# Patient Record
Sex: Female | Born: 1960 | Hispanic: No | Marital: Married | Smoking: Never smoker
Health system: Southern US, Community
[De-identification: ages and names within clinical notes are randomized; demographics above are authoritative.]

## PROBLEM LIST (undated history)

## (undated) DIAGNOSIS — E039 Hypothyroidism, unspecified: Secondary | ICD-10-CM

## (undated) DIAGNOSIS — IMO0002 Reserved for concepts with insufficient information to code with codable children: Secondary | ICD-10-CM

## (undated) DIAGNOSIS — M51369 Other intervertebral disc degeneration, lumbar region without mention of lumbar back pain or lower extremity pain: Secondary | ICD-10-CM

## (undated) DIAGNOSIS — D219 Benign neoplasm of connective and other soft tissue, unspecified: Secondary | ICD-10-CM

## (undated) DIAGNOSIS — J45909 Unspecified asthma, uncomplicated: Secondary | ICD-10-CM

## (undated) DIAGNOSIS — D1801 Hemangioma of skin and subcutaneous tissue: Secondary | ICD-10-CM

## (undated) DIAGNOSIS — H1851 Endothelial corneal dystrophy: Secondary | ICD-10-CM

## (undated) DIAGNOSIS — M5126 Other intervertebral disc displacement, lumbar region: Secondary | ICD-10-CM

## (undated) DIAGNOSIS — L309 Dermatitis, unspecified: Secondary | ICD-10-CM

## (undated) DIAGNOSIS — H02889 Meibomian gland dysfunction of unspecified eye, unspecified eyelid: Secondary | ICD-10-CM

## (undated) DIAGNOSIS — M5136 Other intervertebral disc degeneration, lumbar region: Secondary | ICD-10-CM

## (undated) DIAGNOSIS — L719 Rosacea, unspecified: Secondary | ICD-10-CM

## (undated) DIAGNOSIS — H01009 Unspecified blepharitis unspecified eye, unspecified eyelid: Secondary | ICD-10-CM

## (undated) DIAGNOSIS — D649 Anemia, unspecified: Secondary | ICD-10-CM

## (undated) DIAGNOSIS — B070 Plantar wart: Secondary | ICD-10-CM

## (undated) DIAGNOSIS — F411 Generalized anxiety disorder: Secondary | ICD-10-CM

## (undated) DIAGNOSIS — B029 Zoster without complications: Secondary | ICD-10-CM

## (undated) DIAGNOSIS — I781 Nevus, non-neoplastic: Secondary | ICD-10-CM

## (undated) DIAGNOSIS — N390 Urinary tract infection, site not specified: Secondary | ICD-10-CM

## (undated) DIAGNOSIS — M1990 Unspecified osteoarthritis, unspecified site: Secondary | ICD-10-CM

## (undated) DIAGNOSIS — H269 Unspecified cataract: Secondary | ICD-10-CM

## (undated) DIAGNOSIS — D179 Benign lipomatous neoplasm, unspecified: Secondary | ICD-10-CM

## (undated) HISTORY — DX: Hypothyroidism, unspecified: E03.9

## (undated) HISTORY — DX: Urinary tract infection, site not specified: N39.0

## (undated) HISTORY — PX: URETHRAL STRICTURE DILATATION: SHX477

## (undated) HISTORY — PX: SHOULDER SURGERY: SHX246

## (undated) HISTORY — DX: Unspecified asthma, uncomplicated: J45.909

## (undated) HISTORY — DX: Endothelial corneal dystrophy: H18.51

## (undated) HISTORY — DX: Other intervertebral disc displacement, lumbar region: M51.26

## (undated) HISTORY — PX: WISDOM TOOTH EXTRACTION: SHX21

## (undated) HISTORY — DX: Zoster without complications: B02.9

## (undated) HISTORY — PX: OTHER SURGICAL HISTORY: SHX169

## (undated) HISTORY — PX: CATARACT EXTRACTION, BILATERAL: SHX1313

## (undated) HISTORY — DX: Reserved for concepts with insufficient information to code with codable children: IMO0002

## (undated) HISTORY — PX: CAPSULOTOMY: SHX379

---

## 2007-06-03 ENCOUNTER — Other Ambulatory Visit: Admission: RE | Admit: 2007-06-03 | Discharge: 2007-06-03 | Payer: Self-pay | Admitting: Obstetrics & Gynecology

## 2008-06-15 ENCOUNTER — Other Ambulatory Visit: Admission: RE | Admit: 2008-06-15 | Discharge: 2008-06-15 | Payer: Self-pay | Admitting: Obstetrics & Gynecology

## 2008-09-18 DIAGNOSIS — J309 Allergic rhinitis, unspecified: Secondary | ICD-10-CM | POA: Insufficient documentation

## 2012-04-10 DIAGNOSIS — K439 Ventral hernia without obstruction or gangrene: Secondary | ICD-10-CM | POA: Insufficient documentation

## 2012-04-30 DIAGNOSIS — D179 Benign lipomatous neoplasm, unspecified: Secondary | ICD-10-CM

## 2012-04-30 HISTORY — DX: Benign lipomatous neoplasm, unspecified: D17.9

## 2012-09-16 DIAGNOSIS — H18519 Endothelial corneal dystrophy, unspecified eye: Secondary | ICD-10-CM

## 2012-09-16 HISTORY — DX: Endothelial corneal dystrophy, unspecified eye: H18.519

## 2013-02-24 ENCOUNTER — Encounter: Payer: Self-pay | Admitting: Obstetrics & Gynecology

## 2013-03-07 DIAGNOSIS — L719 Rosacea, unspecified: Secondary | ICD-10-CM | POA: Insufficient documentation

## 2013-03-23 DIAGNOSIS — Z87898 Personal history of other specified conditions: Secondary | ICD-10-CM | POA: Insufficient documentation

## 2013-06-29 DIAGNOSIS — H18519 Endothelial corneal dystrophy, unspecified eye: Secondary | ICD-10-CM | POA: Insufficient documentation

## 2013-06-29 DIAGNOSIS — E349 Endocrine disorder, unspecified: Secondary | ICD-10-CM | POA: Insufficient documentation

## 2013-06-29 DIAGNOSIS — H02889 Meibomian gland dysfunction of unspecified eye, unspecified eyelid: Secondary | ICD-10-CM | POA: Insufficient documentation

## 2013-06-29 DIAGNOSIS — H18839 Recurrent erosion of cornea, unspecified eye: Secondary | ICD-10-CM | POA: Insufficient documentation

## 2013-06-29 DIAGNOSIS — H01009 Unspecified blepharitis unspecified eye, unspecified eyelid: Secondary | ICD-10-CM | POA: Insufficient documentation

## 2013-06-29 DIAGNOSIS — H16223 Keratoconjunctivitis sicca, not specified as Sjogren's, bilateral: Secondary | ICD-10-CM | POA: Insufficient documentation

## 2013-07-16 ENCOUNTER — Telehealth: Payer: Self-pay | Admitting: Certified Nurse Midwife

## 2013-07-16 MED ORDER — NITROFURANTOIN MONOHYD MACRO 100 MG PO CAPS: 100.0000 mg | ORAL_CAPSULE | ORAL | Status: DC | PRN

## 2013-07-16 NOTE — Telephone Encounter (Signed)
Patient wants a refill of Nitrofur-nacro 100 mg  Ph[facility]acy [facility] [place] phone [phone]

## 2013-07-16 NOTE — Telephone Encounter (Signed)
Okay with Dr. Hyacinth Meeker to place refill. Order placed, [facility] with [facility] and patient notified.

## 2013-07-16 NOTE — Telephone Encounter (Signed)
Pt states she has an on going agreement with DL to call in an antibiotic for infection.

## 2013-08-11 ENCOUNTER — Telehealth: Payer: Self-pay | Admitting: Certified Nurse Midwife

## 2013-08-11 MED ORDER — NORETHIN ACE-ETH ESTRAD-FE 1-20 MG-MCG PO TABS: 1.0000 | ORAL_TABLET | Freq: Every day | ORAL | Status: DC

## 2013-08-11 NOTE — Telephone Encounter (Signed)
Patient need refill of Junel fe will run out 5 days before appt. Pharmacy [facility] [place] [phone]

## 2013-08-11 NOTE — Telephone Encounter (Signed)
AEX scheduled for 08/20/13 #1 pack with no refills sent to last patient until AEX. Patient notified

## 2013-08-19 ENCOUNTER — Encounter: Payer: Self-pay | Admitting: Certified Nurse Midwife

## 2013-08-20 ENCOUNTER — Ambulatory Visit (INDEPENDENT_AMBULATORY_CARE_PROVIDER_SITE_OTHER): Payer: BC Managed Care – PPO | Admitting: Certified Nurse Midwife

## 2013-08-20 ENCOUNTER — Encounter: Payer: Self-pay | Admitting: Certified Nurse Midwife

## 2013-08-20 VITALS — BP 100/60 | HR 78 | Resp 18 | Ht 66.0 in | Wt 151.0 lb

## 2013-08-20 DIAGNOSIS — Z0000 Encounter for general adult medical examination without abnormal findings: Secondary | ICD-10-CM

## 2013-08-20 DIAGNOSIS — Z01419 Encounter for gynecological examination (general) (routine) without abnormal findings: Secondary | ICD-10-CM

## 2013-08-20 DIAGNOSIS — Z309 Encounter for contraceptive management, unspecified: Secondary | ICD-10-CM

## 2013-08-20 DIAGNOSIS — Z8041 Family history of malignant neoplasm of ovary: Secondary | ICD-10-CM

## 2013-08-20 LAB — POCT URINALYSIS DIPSTICK
Protein, UA: NEGATIVE
Urobilinogen, UA: NEGATIVE
pH, UA: 6

## 2013-08-20 LAB — HEMOGLOBIN, FINGERSTICK: Hemoglobin, fingerstick: 14.1 g/dL (ref 12.0–16.0)

## 2013-08-20 MED ORDER — NORETHIN ACE-ETH ESTRAD-FE 1-20 MG-MCG PO TABS: 1.0000 | ORAL_TABLET | Freq: Every day | ORAL | Status: DC

## 2013-08-20 NOTE — Progress Notes (Signed)
52 y.o. G2P2002Married Caucasian Fe here for annual exam. Periods normal and no changes. No health issues today. Saw PCP this year with labs done, normal per patient. Busy with work, spouse and exercise!   Patient's last menstrual period was 08/04/2013.          Sexually active: yes  The current method of family planning is OCP (estrogen/progesterone).    Exercising: yes  Walking, lifting weights, cardio, running 5-7 days a week Smoker:  no  Health Maintenance: Pap: 08/12/13 neg MMG: 11/14 normal Colonoscopy:  2013/2014 normal, repeat in 10 years BMD:   never TDaP:  Less than 10 years Labs: Hgb 14.1    U/A neg   reports that she has never smoked. She has never used smokeless tobacco. She reports that she drinks about 0.5 ounces of alcohol per week. She reports that she does not use illicit drugs.  Past Medical History  Diagnosis Date  . Asthma   . Frequent UTI     post coital  . Positional vertigo   . 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 Urethral stricture dilatation      Current Outpatient Prescriptions  Medication Sig Dispense Refill  . aspirin-acetaminophen-caffeine (EXCEDRIN MIGRAINE) 250-250-65 MG per tablet Take by mouth as needed for headache.      . Cholecalciferol (VITAMIN D PO) Take 1,000 Int'l Units by mouth daily.      . Montelukast Sodium (SINGULAIR PO) Take by mouth as needed.      . norethindrone-ethinyl estradiol (JUNEL FE,GILDESS FE,LOESTRIN FE) 1-20 MG-MCG tablet Take 1 tablet by mouth daily.  1 Package  0  . OVER THE COUNTER MEDICATION 2 (two) times daily. Combination of flax seed oil and fish oil daily for dry eyes       No current facility-administered medications for this visit.    Family History  Problem Relation Age of Onset  . Cancer Mother     ovarian  . Hypertension Mother   . Hyperlipidemia Mother   . Breast cancer Maternal Grandmother   . Hyperlipidemia Father   . ALS Father      ROS:  Pertinent items are noted in HPI.  Otherwise, a comprehensive ROS was negative.  Exam:   BP 100/60  Pulse 78  Resp 18  Ht 5\' 6"  (1.676 m)  Wt 151 lb (68.493 kg)  BMI 24.38 kg/m2  LMP 08/04/2013 Height: 5\' 6"  (167.6 cm)  Ht Readings from Last 3 Encounters:  08/20/13 5\' 6"  (1.676 m)    General appearance: alert, cooperative and appears stated age Head: Normocephalic, without obvious abnormality, atraumatic Neck: no adenopathy, supple, symmetrical, trachea midline and thyroid normal to inspection and palpation and non-palpable Lungs: clear to auscultation bilaterally Breasts: normal appearance, no masses or tenderness, No nipple retraction or dimpling, No nipple discharge or bleeding, No axillary or supraclavicular adenopathy Heart: regular rate and rhythm Abdomen: soft, non-tender; no masses,  no organomegaly Extremities: extremities normal, atraumatic, no cyanosis or edema Skin: Skin color, texture, turgor normal. No rashes or lesions Lymph nodes: Cervical, supraclavicular, and axillary nodes normal. No abnormal inguinal nodes palpated Neurologic: Grossly normal   Pelvic: External genitalia:  no lesions              Urethra:  normal appearing urethra with no masses, tenderness or lesions              Bartholin's and Skene's: normal  Vagina: normal appearing vagina with normal color and discharge, no lesions              Cervix: normal, nontender              Pap taken: no Bimanual Exam:  Uterus:  normal size, contour, position, consistency, mobility, non-tender and anteflexed              Adnexa: normal adnexa and no mass, fullness, tenderness               Rectovaginal: Confirms               Anus:  normal sphincter tone, no lesions  A:  Well Woman with normal exam  Contraception OCP desired  Family history of breast and ovarian cancer with negative genetic screen  P:     Reviewed health and wellness pertinent to exam  Rx Loestrin1/20 see order,  discussed decreasing dose next year,and discussed menopausal expectations regarding cycles. Handout given  Patient desires CA 125 and PUS again this year for ovarian screen. Will schedule with Dr Hyacinth Meeker per request  Lab CA 125  Pap smear as per guidelines   Mammogram yearly pap smear not taken today  counseled on breast self exam, mammography screening, menopause, adequate intake of calcium and vitamin D, diet and exercise, Kegel's exercises  return annually or prn  An After Visit Summary was printed and given to the patient.

## 2013-08-20 NOTE — Patient Instructions (Signed)

## 2013-08-21 LAB — CA 125: CA 125: 14.6 U/mL (ref 0.0–30.2)

## 2013-08-23 NOTE — Progress Notes (Signed)
Reviewed personally.  M. Suzanne [place] Dohmen, MD.  

## 2013-08-25 ENCOUNTER — Telehealth: Payer: Self-pay | Admitting: Emergency Medicine

## 2013-08-25 DIAGNOSIS — Z1273 Encounter for screening for malignant neoplasm of ovary: Secondary | ICD-10-CM

## 2013-08-25 NOTE — Telephone Encounter (Signed)
Notes Recorded by Verner Chol, CNM on 08/23/2013 at 8:44 AM Notify CA125 within normal range and has decreased since last year, today 14.6, 2013- 16.0 PUS order placed she will be contacted for scheduling.   Message left to return call to [place] at [phone]. Message left on home number as requested in medical record release form dated 08/20/13.

## 2013-08-25 NOTE — Telephone Encounter (Signed)
Message copied by Joeseph Amor on Wed Aug 25, 2013  8:41 AM ------      Message from: Eliezer Bottom      Created: Mon Aug 23, 2013  2:35 PM       Please notify ------

## 2013-08-26 NOTE — Telephone Encounter (Signed)
Message copied by Joeseph Amor on Thu Aug 26, 2013  9:26 AM ------      Message from: Eliezer Bottom      Created: Mon Aug 23, 2013  2:35 PM       Please notify ------

## 2013-08-26 NOTE — Telephone Encounter (Signed)
Patient called and was informed of PR quote

## 2013-08-26 NOTE — Telephone Encounter (Signed)
Message from Verner Chol CNM given. Patient verbalized understanding. PUS scheduled for patient request 10/07/13. carolynn can you precert.

## 2013-08-26 NOTE — Telephone Encounter (Signed)
LMTCB to discuss insurance benefits with patient.

## 2013-10-07 ENCOUNTER — Ambulatory Visit (INDEPENDENT_AMBULATORY_CARE_PROVIDER_SITE_OTHER): Payer: BC Managed Care – PPO | Admitting: Obstetrics & Gynecology

## 2013-10-07 ENCOUNTER — Other Ambulatory Visit: Payer: Self-pay | Admitting: Obstetrics & Gynecology

## 2013-10-07 ENCOUNTER — Ambulatory Visit (INDEPENDENT_AMBULATORY_CARE_PROVIDER_SITE_OTHER): Payer: BC Managed Care – PPO

## 2013-10-07 VITALS — BP 102/68 | Ht 66.0 in | Wt 155.0 lb

## 2013-10-07 DIAGNOSIS — N83299 Other ovarian cyst, unspecified side: Secondary | ICD-10-CM

## 2013-10-07 DIAGNOSIS — N83209 Unspecified ovarian cyst, unspecified side: Secondary | ICD-10-CM

## 2013-10-07 DIAGNOSIS — Z1273 Encounter for screening for malignant neoplasm of ovary: Secondary | ICD-10-CM

## 2013-10-07 DIAGNOSIS — D259 Leiomyoma of uterus, unspecified: Secondary | ICD-10-CM

## 2013-10-07 DIAGNOSIS — D219 Benign neoplasm of connective and other soft tissue, unspecified: Secondary | ICD-10-CM

## 2013-10-07 DIAGNOSIS — Z803 Family history of malignant neoplasm of breast: Secondary | ICD-10-CM

## 2013-10-07 DIAGNOSIS — Z8041 Family history of malignant neoplasm of ovary: Secondary | ICD-10-CM

## 2013-10-07 NOTE — Progress Notes (Signed)
53 y.o.  G2P2 MWF here for a pelvic ultrasound due to strong family hx of breast and ovarian cancer.  Pt's mother died in her 40s of breast cancer and there are three toher woman on her mother's side with breast cancer.  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  Initially it was unsure whether this was important.  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  Pt underwent testing and was negative but still has concerns.  Has considered prophylactic surgery.  Currently, on OCPs for risk reduction and BC.  Sexually active:  Yes  Contraception: oral contraceptives (estrogen/progesterone)  FINDINGS: UTERUS: 8.4 x 5.9 x 5.8cm with 2.1cm, 1.2cm and 1.9cm fibroids (last u/s 2 years ago showed only 2 fibroids 23mm and 36mm each EMS: 8.74mm ADNEXA:   Left ovary 2.6 x 1.3 x 1.3cm with 61mm collapsed corpus luteal cyst     Right ovary 4.2 x 3.4 x 2.9cm with 1.7 and 2.7cm cysts, smooth walled and avascular. Prior u/s 11/13 showed no cysts on right and 3.4cm cyst on left CUL DE SAC: no free fluid  Recent Ca-125 was 16.  In EPIC.  Reviewed images with patient as well as her history.  She desires yearly PUS but didn't do last year when I was out on maternity leave.  Encouraged her to do this yearly, even if I am not here for some reason.  She states that she really just wants to have these conversations with me so she cannot guarantee she will continue ultrasound/ca-125 with anyone else.  However, she is glad I am back and comfortable with today's findings.    I reviewed hx with her and updated family hx in [place].  Maternal great aunt, maternal second cousin (mother's first cousin), and Maternal great grand mother with breast cancer.  Also, will scan genetic testing into EPIC so will be part of electronic chart.  Pt in agreement with this as well.  Assessment:  Uterine fibroids, ovarian cysts that appear functional, Strong family hx of breast and  ovarian cancer  Plan: AEX 1 year.  Repeat PUS and Ca-125 then.  ~25 minutes spent with patient >50% of time was in face to face discussion of above.

## 2013-10-08 ENCOUNTER — Encounter: Payer: Self-pay | Admitting: Obstetrics & Gynecology

## 2013-10-08 DIAGNOSIS — N83299 Other ovarian cyst, unspecified side: Secondary | ICD-10-CM | POA: Insufficient documentation

## 2013-10-08 DIAGNOSIS — D259 Leiomyoma of uterus, unspecified: Secondary | ICD-10-CM | POA: Insufficient documentation

## 2013-10-08 DIAGNOSIS — Z8041 Family history of malignant neoplasm of ovary: Secondary | ICD-10-CM | POA: Insufficient documentation

## 2013-10-08 DIAGNOSIS — Z803 Family history of malignant neoplasm of breast: Secondary | ICD-10-CM | POA: Insufficient documentation

## 2013-10-08 NOTE — Patient Instructions (Signed)
Please call with any new problems or concerns.

## 2014-06-17 ENCOUNTER — Telehealth: Payer: Self-pay | Admitting: Certified Nurse Midwife

## 2014-06-17 NOTE — Telephone Encounter (Signed)
Not needed

## 2014-06-20 ENCOUNTER — Other Ambulatory Visit: Payer: Self-pay | Admitting: Certified Nurse Midwife

## 2014-06-20 ENCOUNTER — Telehealth: Payer: Self-pay | Admitting: Certified Nurse Midwife

## 2014-06-20 DIAGNOSIS — N951 Menopausal and female climacteric states: Secondary | ICD-10-CM

## 2014-06-20 NOTE — Telephone Encounter (Signed)
Patient calling requesting to speak with nurse about coordinating her lab work she needs with her menstrual cycle.

## 2014-06-20 NOTE — Telephone Encounter (Signed)
Orders in encounter closed

## 2014-06-20 NOTE — Telephone Encounter (Signed)
Spoke with patient. Patient states that she is currently on OCP and was told by Regina Eck CNM that she needed to come in when she turned 41 for blood work. Was advised needs to be on last day of placebo pills in OCP pack so that levels can be checked and she can stop OCP or get placed on a lower dose. Patient would like to come in on last day of OCP before aex. Appointment scheduled for Nov 30th at 8:30am as she states this will fall perfect with her pack.  Regina Eck CNM please place orders.

## 2014-07-18 ENCOUNTER — Encounter: Payer: Self-pay | Admitting: Obstetrics & Gynecology

## 2014-07-19 ENCOUNTER — Telehealth: Payer: Self-pay | Admitting: Certified Nurse Midwife

## 2014-07-19 ENCOUNTER — Ambulatory Visit (INDEPENDENT_AMBULATORY_CARE_PROVIDER_SITE_OTHER): Payer: BC Managed Care – PPO | Admitting: Nurse Practitioner

## 2014-07-19 ENCOUNTER — Encounter: Payer: Self-pay | Admitting: Nurse Practitioner

## 2014-07-19 VITALS — BP 100/60 | HR 88 | Temp 98.1°F | Ht 66.0 in | Wt 159.0 lb

## 2014-07-19 DIAGNOSIS — R300 Dysuria: Secondary | ICD-10-CM

## 2014-07-19 DIAGNOSIS — R350 Frequency of micturition: Secondary | ICD-10-CM

## 2014-07-19 MED ORDER — CIPROFLOXACIN HCL 500 MG PO TABS: 500.0000 mg | ORAL_TABLET | Freq: Two times a day (BID) | ORAL | Status: DC

## 2014-07-19 NOTE — Telephone Encounter (Signed)
Dr. Sabra Heck, please disregard prior message to you.  Spoke with patient.  She has used Macrobid 100 mg bid x 7 days, starting last Saturday 07/09/14 and completing 07/16/14. Feels that all symptoms returned last night, dysuria, increased frequency. No fevers or back pain.  Patient requesting another rx for treatment. She has restarted Macrobid last night and taken two doses with some relief.  Advised needs office visit if requests additional treatment. Patient scheduled for office visit with Milford Cage, [place] today at 1530, patient choice of time.  Routing to [employer], FNP

## 2014-07-19 NOTE — Telephone Encounter (Signed)
Patient calling requesting to speak with the nurse about prescribing another antibiotic to treat her urinary tract infection. She declined an appointment at this time.  [id number] [place], [place] - [address]

## 2014-07-19 NOTE — Progress Notes (Signed)
S:  53 y.o.MW Fe  presents with complaint of UTI. Symptoms that began on October 24- 31 treated with Macrobid. With symptoms of dysuria, urinary frequency, urinary urgency. Pertinent negatives: patient is having no constitutional symptoms, denying fever, chills, anorexia, or weight loss.. Sexually active yes  Symptoms related to post coital: Yes.  Current method of birth control is OCP.  Same partner without change. Last UTI documented  2 weeks ago. Chronic history of UTI and several urology evaluations.  History of Pyelonephritis 07/16/13.    ROS:  fatigued and chills at times this am  O alert, oriented to person, place, and time, normal mood, behavior, speech, dress, motor activity, and thought processes   healthy,  alert and  not in acute distress  mild  No CVA tenderness  deferred   Diagnostic Test:    Urinalysis:  Chemstrip not easily seen secondary to AZO - but nitrites did turn + home urine chemstrip also + for nitrites and leuk's   urine culture  Assessment: R/O UTI   Plan:  Maintain adequate hydration. Follow up if symptoms not improving, and as needed.  Medication Therapy: Cipro 500 mg BID for a week  Discussed a change of preventative antibiotics from Macrobid to Septra - but need to wait on culture.   Lab:TOC if Urine Culture is positive        RV

## 2014-07-19 NOTE — Telephone Encounter (Signed)
Patient with hx of post coital UTI's. Has prior Rx for Macrobid 100 mg PO from Dr. Sabra Heck from 07/16/13. Next annual scheduled with Regina Eck CNM 08/30/14.  Okay to place refill?   Message left to return call to [place] at [phone].

## 2014-07-19 NOTE — Telephone Encounter (Signed)
Pt calling Olivia Mackie Fast back. Pt states she will be in her office for the next hour and a half, so called there if possible [phone]

## 2014-07-19 NOTE — Patient Instructions (Signed)

## 2014-07-20 LAB — URINE CULTURE
COLONY COUNT: NO GROWTH
Organism ID, Bacteria: NO GROWTH

## 2014-07-20 LAB — URINALYSIS, MICROSCOPIC ONLY
CASTS: NONE SEEN
Crystals: NONE SEEN

## 2014-07-20 NOTE — Progress Notes (Signed)
Encounter reviewed by Dr. Brook Silva.  

## 2014-07-27 ENCOUNTER — Telehealth: Payer: Self-pay | Admitting: Nurse Practitioner

## 2014-07-27 NOTE — Telephone Encounter (Signed)
Pt calling for results of urine culture

## 2014-07-27 NOTE — Telephone Encounter (Signed)
Spoke with patient. Advised of results as seen below from Milford Cage, [place]. States that she has completed full dosage of Cipro one day ago and thinks she may be having symptoms again. "I am concerned because I was already taking an antibiotic when I did the culture which could cause it to show negative. [place] different [place] now and can't get to you guys. I am going to monitor it and if it gets worse or doesn't go away I will be seen some where here for them to recheck my urine." Advised patient if symptoms increase or are not resolving we would be happy to get patient in to see Milford Cage, FNP for another culture and treatment. If she is out of the area will need to be seen at urgent care so that she has proper treatment. Patient is agreeable.  Notes Recorded by Milford Cage, FNP on 07/24/2014 at 7:22 PM Let patient know that urine culture was negative. She has enough med's now that if this was just urethritis her symptoms should be better so she can stop med's..  Routing to provider for final review. Patient agreeable to disposition. Will close encounter

## 2014-08-15 ENCOUNTER — Other Ambulatory Visit (INDEPENDENT_AMBULATORY_CARE_PROVIDER_SITE_OTHER): Payer: BC Managed Care – PPO

## 2014-08-15 DIAGNOSIS — N951 Menopausal and female climacteric states: Secondary | ICD-10-CM

## 2014-08-16 LAB — FOLLICLE STIMULATING HORMONE: FSH: 8.8 m[IU]/mL

## 2014-08-18 LAB — ANTI MULLERIAN HORMONE: AMH AssessR: <0.03 ng/mL

## 2014-08-19 ENCOUNTER — Telehealth: Payer: Self-pay

## 2014-08-19 NOTE — Telephone Encounter (Signed)
Spoke with patient. Patient would like to know if results have returned. Advised of results as seen below. "She was checking these levels to see if I needed to start on a lower dose of birth control or could stop my birth control all together." Advised patient would send a message to Regina Eck CNM and return call with further recommendations and instructions. Patient is agreeable   Notes Recorded by Regina Eck, CNM on 08/19/2014 at 12:57 PM Notify patient that [facility] is not showing menopause as of yet, but [facility] shows no fertility.   Routing to provider for final review. Patient agreeable to disposition. Will close encounter

## 2014-08-23 ENCOUNTER — Ambulatory Visit: Payer: BC Managed Care – PPO | Admitting: Certified Nurse Midwife

## 2014-08-23 NOTE — Telephone Encounter (Signed)
She can stop OCP if desired.

## 2014-08-23 NOTE — Telephone Encounter (Signed)
Spoke with patient. Advised patient of message as seen below. Patient is agreeable and verbalizes understanding. Will stop OCP.  Routing to provider for final review. Patient agreeable to disposition. Will close encounter

## 2014-08-30 ENCOUNTER — Ambulatory Visit (INDEPENDENT_AMBULATORY_CARE_PROVIDER_SITE_OTHER): Payer: BC Managed Care – PPO | Admitting: Certified Nurse Midwife

## 2014-08-30 ENCOUNTER — Encounter: Payer: Self-pay | Admitting: Certified Nurse Midwife

## 2014-08-30 VITALS — BP 100/68 | HR 100 | Resp 16 | Ht 65.5 in | Wt 159.0 lb

## 2014-08-30 DIAGNOSIS — Z8041 Family history of malignant neoplasm of ovary: Secondary | ICD-10-CM

## 2014-08-30 DIAGNOSIS — Z124 Encounter for screening for malignant neoplasm of cervix: Secondary | ICD-10-CM

## 2014-08-30 DIAGNOSIS — Z0000 Encounter for general adult medical examination without abnormal findings: Secondary | ICD-10-CM

## 2014-08-30 DIAGNOSIS — N390 Urinary tract infection, site not specified: Secondary | ICD-10-CM

## 2014-08-30 DIAGNOSIS — Z01419 Encounter for gynecological examination (general) (routine) without abnormal findings: Secondary | ICD-10-CM

## 2014-08-30 LAB — POCT URINALYSIS DIPSTICK
BILIRUBIN UA: NEGATIVE
GLUCOSE UA: NEGATIVE
KETONES UA: NEGATIVE
Nitrite, UA: NEGATIVE
Protein, UA: NEGATIVE
Urobilinogen, UA: NEGATIVE
pH, UA: 5

## 2014-08-30 LAB — HEMOGLOBIN, FINGERSTICK: Hemoglobin, fingerstick: 14.6 g/dL (ref 12.0–16.0)

## 2014-08-30 NOTE — Patient Instructions (Signed)

## 2014-08-30 NOTE — Progress Notes (Signed)
53 y.o. G85P2002 Married Caucasian Fe here for annual exam. Periods normal, no issues. Plans to stop OCP with this cycle. She has had labs to indicate no fertility. [place]ley did not indicate menopause range and patient is aware she will continue to have menses. Busy with work! Worried about Earlie Raveling who saw a Ship broker at [employer] commit suicide. Has her in counseling,which is helping.. Patient sees PCP for aex/labs, no issues. Having asthma flare, but using meds. Denies any urinary symptoms, but history of 3 UTI's in past year with  PCP management.No other health issues today.   Patient's last menstrual period was 08/09/2014.          Sexually active: Yes.    The current method of family planning is OCP (estrogen/progesterone).    Exercising: Yes.    Gym, weights, cardio, walking 3 x weekly Smoker:  no  Health Maintenance: Pap:  07/2012 Neg HR HPV:Neg MMG: 07/2014 Normal Colonoscopy: 2013 Normal 10 year plan BMD:   Never TDaP: 2005  Labs: Here UA:WBC=trace, RBC=trace Not clean Catch Hg:14.6   reports that she has never smoked. She has never used smokeless tobacco. She reports that she drinks about 0.5 oz of alcohol per week. She reports that she does not use illicit drugs.  Past Medical History  Diagnosis Date  . Asthma   . Frequent UTI     post coital  . Positional vertigo   . 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 Shingles     Past Surgical History  Procedure Laterality Date  . Urethral stricture dilatation      Current Outpatient Prescriptions  Medication Sig Dispense Refill  . albuterol (PROAIR HFA) 108 (90 BASE) MCG/ACT inhaler Inhale 2 puffs into the lungs as needed.    Marland Kitchen aspirin-acetaminophen-caffeine (EXCEDRIN MIGRAINE) 222-979-89 MG per tablet Take by mouth as needed for headache.    . Azelaic Acid 15 % cream Massage a thin film of gel into the affected area twice daily.    Marland Kitchen CARBOXYMETHYLCELLULOSE SODIUM OP Apply to eye.    . meclizine (ANTIVERT) 25 MG tablet Take 25 mg  by mouth 3 (three) times daily as needed for dizziness.     . montelukast (SINGULAIR) 10 MG tablet As needed for colds    . norethindrone-ethinyl estradiol (JUNEL FE,GILDESS FE,LOESTRIN FE) 1-20 MG-MCG tablet Take 1 tablet by mouth daily. 1 Package 12  . promethazine (PHENERGAN) 25 MG tablet Take 25 mg by mouth as needed for nausea.     . sodium chloride (MURO 128) 5 % ophthalmic solution 1 drop.    . nitrofurantoin, macrocrystal-monohydrate, (MACROBID) 100 MG capsule      No current facility-administered medications for this visit.    Family History  Problem Relation Age of Onset  . Cancer Mother 56    ovarian  . Hypertension Mother   . Hyperlipidemia Mother   . Prostate cancer Maternal Grandfather   . Hyperlipidemia Father   . ALS Father   . Breast cancer Other     MGGM  . Breast cancer Other     Mother's first cousin  . Breast cancer Other     Maternal great aunt    ROS:  Pertinent items are noted in HPI.  Otherwise, a comprehensive ROS was negative.  Exam:   BP 100/68 mmHg  Pulse 100  Resp 16  Ht 5' 5.5" (1.664 m)  Wt 159 lb (72.122 kg)  BMI 26.05 kg/m2  LMP 08/09/2014 Height: 5' 5.5" (166.4 cm)  Ht Readings from Last 3 Encounters:  08/30/14 5' 5.5" (1.664 m)  07/19/14 5\' 6"  (1.676 m)  10/07/13 5\' 6"  (1.676 m)    General appearance: alert, cooperative and appears stated age Head: Normocephalic, without obvious abnormality, atraumatic Neck: no adenopathy, supple, symmetrical, trachea midline and thyroid normal to inspection and palpation Lungs: clear to auscultation bilaterally Breasts: normal appearance, no masses or tenderness, No nipple retraction or dimpling, No nipple discharge or bleeding, No axillary or supraclavicular adenopathy Heart: regular rate and rhythm Abdomen: soft, non-tender; no masses,  no organomegaly Extremities: extremities normal, atraumatic, no cyanosis or edema Skin: Skin color, texture, turgor normal. No rashes or lesions Lymph nodes:  Cervical, supraclavicular, and axillary nodes normal. No abnormal inguinal nodes palpated Neurologic: Grossly normal   Pelvic: External genitalia:  no lesions              Urethra:  normal appearing urethra with no masses, tenderness or lesions              Bartholin's and Skene's: normal                 Vagina: normal appearing vagina with normal color and discharge, no lesions              Cervix: normal, non tender, no lesions              Pap taken: Yes.   Bimanual Exam:  Uterus:  normal size, contour, position, consistency, mobility, non-tender and anteverted              Adnexa: normal adnexa and no mass, fullness, tenderness               Rectovaginal: Confirms               Anus:  normal sphincter tone, no lesions  A:  Well Woman with normal exam  Perimenopausal  Contraception will stop after this cycle  History of asthma  History of coital related UTI, has Rx Macrobid from PCP for use  R/O UTI  Immunization update  P:   Reviewed health and wellness pertinent to exam  Discussed etiology and expectations. Encouraged to keep menses calendar and advise if no menses in 3 months. Questions addressed.  Continue follow up with PCP as indicated.  Lab Urine micro, culture.  Pap smear taken today with HPV reflex  Declines today due to asthma will come in and have done   counseled on breast self exam, mammography screening, adequate intake of calcium and vitamin D, diet and exercise  return annually or prn  An After Visit Summary was printed and given to the patient.

## 2014-08-31 LAB — URINALYSIS, MICROSCOPIC ONLY
CASTS: NONE SEEN
Crystals: NONE SEEN
Squamous Epithelial / LPF: NONE SEEN

## 2014-08-31 LAB — CA 125: CA 125: 17 U/mL (ref <35)

## 2014-08-31 NOTE — Progress Notes (Signed)
Should remind pt about possible pregnancy risk if stops OCPs.  Reviewed personally.  Felipa Emory, MD.

## 2014-08-31 NOTE — Addendum Note (Signed)
Addended by: Regina Eck on: 08/31/2014 12:58 PM   Modules accepted: Orders, SmartSet

## 2014-09-01 LAB — URINE CULTURE
COLONY COUNT: NO GROWTH
ORGANISM ID, BACTERIA: NO GROWTH

## 2014-09-01 LAB — IPS PAP TEST WITH REFLEX TO HPV

## 2014-09-06 ENCOUNTER — Other Ambulatory Visit: Payer: Self-pay | Admitting: Certified Nurse Midwife

## 2014-09-06 ENCOUNTER — Telehealth: Payer: Self-pay | Admitting: Certified Nurse Midwife

## 2014-09-06 NOTE — Telephone Encounter (Signed)
Updated patient immunization information in EPIC for Tdap given in 2007.  Routing to provider for final review. Patient agreeable to disposition. Will close encounter

## 2014-09-06 NOTE — Telephone Encounter (Signed)
According to 08/30/14 AEX note patient was to stop ocp after cycle.-Denied.

## 2014-09-06 NOTE — Telephone Encounter (Signed)
Pt checked with her pcp and her last tetnus shot was in 2007. Wanted to give Ms Jill Cross this update.

## 2014-09-13 ENCOUNTER — Telehealth: Payer: Self-pay | Admitting: Obstetrics & Gynecology

## 2014-09-13 DIAGNOSIS — Z8041 Family history of malignant neoplasm of ovary: Secondary | ICD-10-CM

## 2014-09-13 NOTE — Telephone Encounter (Signed)
Spoke with patient. Advised that per 2016 benefit quote received, she will be responsible to pay a $30 copay when she comes in for PUS. Patient agreeable. Scheduled PUS. Advised patient of 72 hour cancellation policy and $499 cancellation fee. Patient agreeable.

## 2014-09-13 NOTE — Addendum Note (Signed)
Addended by: Michele Mcalpine on: 09/13/2014 01:47 PM   Modules accepted: Orders

## 2014-09-20 DIAGNOSIS — M2560 Stiffness of unspecified joint, not elsewhere classified: Secondary | ICD-10-CM

## 2014-09-20 DIAGNOSIS — M2569 Stiffness of other specified joint, not elsewhere classified: Secondary | ICD-10-CM | POA: Insufficient documentation

## 2014-09-20 DIAGNOSIS — M5441 Lumbago with sciatica, right side: Secondary | ICD-10-CM | POA: Insufficient documentation

## 2014-09-29 ENCOUNTER — Other Ambulatory Visit: Payer: BC Managed Care – PPO | Admitting: Obstetrics & Gynecology

## 2014-09-29 ENCOUNTER — Telehealth: Payer: Self-pay | Admitting: Obstetrics & Gynecology

## 2014-09-29 ENCOUNTER — Other Ambulatory Visit: Payer: BC Managed Care – PPO

## 2014-09-29 NOTE — Telephone Encounter (Signed)
Call to patient to reschedule PUS. Left message for patient to call back.

## 2014-09-29 NOTE — Telephone Encounter (Signed)
Called and spoke with patient to cancel her PUS for today due to the sonographer had a death in the family. Please call patient to reschedule.

## 2014-10-11 NOTE — Telephone Encounter (Signed)
Left message for patient to call back. Need to reschedule PUS

## 2014-10-12 NOTE — Telephone Encounter (Signed)
Patient returning call. She will be available for the next half hour.

## 2014-10-14 NOTE — Telephone Encounter (Signed)
Left message for patient to call back  

## 2014-10-17 NOTE — Telephone Encounter (Signed)
Spoke with patient. Reschedule PUS to 02.18.2016.

## 2014-11-01 ENCOUNTER — Other Ambulatory Visit: Payer: Self-pay | Admitting: Obstetrics & Gynecology

## 2014-11-01 NOTE — Telephone Encounter (Signed)
Medication refill request: Macrobid 100 mg  Last AEX:  08/30/14 with Ms. Debbie Next AEX: 10/09/15 with Ms. Debbie Last MMG (if hormonal medication request): N/A Refill authorized: Please advise.

## 2014-11-01 NOTE — Telephone Encounter (Signed)
Ok to fill she uses for post coital treatment for UTI prevention

## 2014-11-03 ENCOUNTER — Ambulatory Visit (INDEPENDENT_AMBULATORY_CARE_PROVIDER_SITE_OTHER): Payer: BC Managed Care – PPO

## 2014-11-03 ENCOUNTER — Ambulatory Visit (INDEPENDENT_AMBULATORY_CARE_PROVIDER_SITE_OTHER): Payer: BC Managed Care – PPO | Admitting: Obstetrics & Gynecology

## 2014-11-03 VITALS — BP 98/68 | Resp 12 | Ht 65.5 in | Wt 162.0 lb

## 2014-11-03 DIAGNOSIS — Z8041 Family history of malignant neoplasm of ovary: Secondary | ICD-10-CM

## 2014-11-03 DIAGNOSIS — D251 Intramural leiomyoma of uterus: Secondary | ICD-10-CM

## 2014-11-03 DIAGNOSIS — N8320 Unspecified ovarian cysts: Secondary | ICD-10-CM

## 2014-11-03 DIAGNOSIS — N951 Menopausal and female climacteric states: Secondary | ICD-10-CM

## 2014-11-03 DIAGNOSIS — R635 Abnormal weight gain: Secondary | ICD-10-CM

## 2014-11-03 DIAGNOSIS — N83202 Unspecified ovarian cyst, left side: Secondary | ICD-10-CM

## 2014-11-03 LAB — THYROID PANEL WITH TSH
FREE THYROXINE INDEX: 1.6 (ref 1.4–3.8)
T3 UPTAKE: 30 % (ref 22–35)
T4, Total: 5.4 ug/dL (ref 4.5–12.0)
TSH: 1.233 u[IU]/mL (ref 0.350–4.500)

## 2014-11-03 NOTE — Progress Notes (Signed)
54 y.o. Marriedfemale here for a pelvic ultrasound due to family hx of ovarian cancer.  Pt did have a ca-125 obtained 08/30/14 which was normal.  Pt also has known fibroids.  Pt has some questions about thyroid disease.  She has had some hair loss and feels fatigued most of the time.  She has done some Internet research and she feels this could be contributing to her issues.  Patient's last menstrual period was 11/02/2014.  Sexually active:  yes  Contraception: oral contraceptives (estrogen/progesterone)  FINDINGS: UTERUS: 9.1 x 5.8 x 5.8cm with 2.0cm, 1.5cm, and 1.7cm intramural fibroids (compared to prior ultrasound there are no changes) EMS: 9.42mm ADNEXA:  Left ovary 3.5 x 2.3 x 1.9cm with 2.0cm corpus luteal cyst   Right ovary 2.3 x 1.7 x 1.0cm CUL DE SAC: no free fluid  D/W pt images.  Fibroids are stable from prior exam.  Ovarian cyst appears to be a normal functional corpus luteal cyst.  Reassured pt regarding this.  Normal Ca-125 noted.    Assessment:  Family hx of ovarian cancer Uterine fibroids Left corpus luteal cyst Fatigue and hair changes  Plan: TSH and panel FSH pending Pt will follow up for AEX 1 year or prn issues.  ~15 minutes spent with patient >50% of time was in face to face discussion of above.

## 2014-11-04 LAB — FOLLICLE STIMULATING HORMONE: FSH: 7 m[IU]/mL

## 2014-11-06 ENCOUNTER — Encounter: Payer: Self-pay | Admitting: Obstetrics & Gynecology

## 2014-11-07 ENCOUNTER — Other Ambulatory Visit: Payer: Self-pay

## 2014-11-07 MED ORDER — NORETHIN ACE-ETH ESTRAD-FE 1-20 MG-MCG PO TABS: 1.0000 | ORAL_TABLET | Freq: Every day | ORAL | Status: DC

## 2014-11-07 NOTE — Telephone Encounter (Signed)
Dr Quincy Simmonds, will you approve this medication. Please advise when patient should restart, as she still cycles monthly.//kn

## 2014-11-07 NOTE — Telephone Encounter (Signed)
-----   Message from Lyman Speller, MD sent at 11/04/2014 10:23 AM EST ----- Please inform pt that TSH and panel are all normal.  FSH is low.  I would, highly recommend, with this value restarting her OCPs.  This is an [place] off OCPs and although pregnancy risk is low with low AMH this makes me much more anxious about pregnancy risk for her.

## 2014-11-07 NOTE — Telephone Encounter (Signed)
I would recommend doing a Sunday start with her next cycle.  Use condom protection for the first month of use and use condoms currently for sexual activity.

## 2014-11-08 NOTE — Telephone Encounter (Signed)
Patient aware//kn

## 2014-12-29 ENCOUNTER — Telehealth: Payer: Self-pay | Admitting: Certified Nurse Midwife

## 2014-12-29 NOTE — Telephone Encounter (Signed)
Spoke with pt regarding Provider cx. Pt voiced understanding and states she will cb and rs. Recall entered.

## 2015-01-18 DIAGNOSIS — H25812 Combined forms of age-related cataract, left eye: Secondary | ICD-10-CM | POA: Insufficient documentation

## 2015-02-23 DIAGNOSIS — J45909 Unspecified asthma, uncomplicated: Secondary | ICD-10-CM | POA: Insufficient documentation

## 2015-02-28 DIAGNOSIS — Z9889 Other specified postprocedural states: Secondary | ICD-10-CM | POA: Insufficient documentation

## 2015-05-20 ENCOUNTER — Other Ambulatory Visit: Payer: Self-pay | Admitting: Certified Nurse Midwife

## 2015-05-23 NOTE — Telephone Encounter (Signed)
Medication refill request: Macrobid Last AEX:  08/30/14 with DL Next AEX: 10/09/2015 patieint cancelled appt. Last MMG (if hormonal medication request):  Refill authorized: please advise.

## 2015-07-03 DIAGNOSIS — Z9889 Other specified postprocedural states: Secondary | ICD-10-CM | POA: Insufficient documentation

## 2015-10-03 ENCOUNTER — Encounter: Payer: Self-pay | Admitting: Certified Nurse Midwife

## 2015-10-03 ENCOUNTER — Ambulatory Visit (INDEPENDENT_AMBULATORY_CARE_PROVIDER_SITE_OTHER): Payer: BC Managed Care – PPO | Admitting: Certified Nurse Midwife

## 2015-10-03 VITALS — BP 102/66 | HR 68 | Resp 16 | Ht 65.75 in | Wt 161.0 lb

## 2015-10-03 DIAGNOSIS — Z23 Encounter for immunization: Secondary | ICD-10-CM | POA: Diagnosis not present

## 2015-10-03 DIAGNOSIS — L298 Other pruritus: Secondary | ICD-10-CM | POA: Diagnosis not present

## 2015-10-03 DIAGNOSIS — Z8041 Family history of malignant neoplasm of ovary: Secondary | ICD-10-CM

## 2015-10-03 DIAGNOSIS — Z01419 Encounter for gynecological examination (general) (routine) without abnormal findings: Secondary | ICD-10-CM

## 2015-10-03 DIAGNOSIS — N898 Other specified noninflammatory disorders of vagina: Secondary | ICD-10-CM

## 2015-10-03 DIAGNOSIS — Z0000 Encounter for general adult medical examination without abnormal findings: Secondary | ICD-10-CM

## 2015-10-03 DIAGNOSIS — Z3041 Encounter for surveillance of contraceptive pills: Secondary | ICD-10-CM

## 2015-10-03 DIAGNOSIS — Z87440 Personal history of urinary (tract) infections: Secondary | ICD-10-CM

## 2015-10-03 LAB — POCT URINALYSIS DIPSTICK
Bilirubin, UA: NEGATIVE
Glucose, UA: NEGATIVE
KETONES UA: NEGATIVE
Leukocytes, UA: NEGATIVE
Nitrite, UA: NEGATIVE
PROTEIN UA: NEGATIVE
RBC UA: NEGATIVE
UROBILINOGEN UA: NEGATIVE
pH, UA: 5

## 2015-10-03 MED ORDER — NORETHIN ACE-ETH ESTRAD-FE 1-20 MG-MCG PO TABS: 1.0000 | ORAL_TABLET | Freq: Every day | ORAL | Status: DC

## 2015-10-03 MED ORDER — NITROFURANTOIN MONOHYD MACRO 100 MG PO CAPS: 100.0000 mg | ORAL_CAPSULE | Freq: Two times a day (BID) | ORAL | Status: DC

## 2015-10-03 NOTE — Patient Instructions (Signed)
EXERCISE AND DIET:  We recommended that you start or continue a regular exercise program for good health. Regular exercise means any activity that makes your heart beat faster and makes you sweat.  We recommend exercising at least 30 minutes per day at least 3 days a week, preferably 4 or 5.  We also recommend a diet low in fat and sugar.  Inactivity, poor dietary choices and obesity can cause diabetes, heart attack, stroke, and kidney damage, among others.    ALCOHOL AND SMOKING:  Women should limit their alcohol intake to no more than 7 drinks/beers/glasses of wine (combined, not each!) per week. Moderation of alcohol intake to this level decreases your risk of breast cancer and liver damage. And of course, no recreational drugs are part of a healthy lifestyle.  And absolutely no smoking or even second hand smoke. Most people know smoking can cause heart and lung diseases, but did you know it also contributes to weakening of your bones? Aging of your skin?  Yellowing of your teeth and nails?  CALCIUM AND VITAMIN D:  Adequate intake of calcium and Vitamin D are recommended.  The recommendations for exact amounts of these supplements seem to change often, but generally speaking 600 mg of calcium (either carbonate or citrate) and 800 units of Vitamin D per day seems prudent. Certain women may benefit from higher intake of Vitamin D.  If you are among these women, your doctor will have told you during your visit.    PAP SMEARS:  Pap smears, to check for cervical cancer or precancers,  have traditionally been done yearly, although recent scientific advances have shown that most women can have pap smears less often.  However, every woman still should have a physical exam from her gynecologist every year. It will include a breast check, inspection of the vulva and vagina to check for abnormal growths or skin changes, a visual exam of the cervix, and then an exam to evaluate the size and shape of the uterus and  ovaries.  And after 54 years of age, a rectal exam is indicated to check for rectal cancers. We will also provide age appropriate advice regarding health maintenance, like when you should have certain vaccines, screening for sexually transmitted diseases, bone density testing, colonoscopy, mammograms, etc.   MAMMOGRAMS:  All women over 40 years old should have a yearly mammogram. Many facilities now offer a "3D" mammogram, which may cost around $50 extra out of pocket. If possible,  we recommend you accept the option to have the 3D mammogram performed.  It both reduces the number of women who will be called back for extra views which then turn out to be normal, and it is better than the routine mammogram at detecting truly abnormal areas.    COLONOSCOPY:  Colonoscopy to screen for colon cancer is recommended for all women at age 50.  We know, you hate the idea of the prep.  We agree, BUT, having colon cancer and not knowing it is worse!!  Colon cancer so often starts as a polyp that can be seen and removed at colonscopy, which can quite literally save your life!  And if your first colonoscopy is normal and you have no family history of colon cancer, most women don't have to have it again for 10 years.  Once every ten years, you can do something that may end up saving your life, right?  We will be happy to help you get it scheduled when you are ready.    Be sure to check your insurance coverage so you understand how much it will cost.  It may be covered as a preventative service at no cost, but you should check your particular policy.     Perimenopause Perimenopause is the time when your body begins to move into the menopause (no menstrual period for 12 straight months). It is a natural process. Perimenopause can begin 2-8 years before the menopause and usually lasts for 1 year after the menopause. During this time, your ovaries may or may not produce an egg. The ovaries vary in their production of estrogen and  progesterone hormones each month. This can cause irregular menstrual periods, difficulty getting pregnant, vaginal bleeding between periods, and uncomfortable symptoms. CAUSES  Irregular production of the ovarian hormones, estrogen and progesterone, and not ovulating every month.  Other causes include:  Tumor of the pituitary gland in the brain.  Medical disease that affects the ovaries.  Radiation treatment.  Chemotherapy.  Unknown causes.  Heavy smoking and excessive alcohol intake can bring on perimenopause sooner. SIGNS AND SYMPTOMS   Hot flashes.  Night sweats.  Irregular menstrual periods.  Decreased sex drive.  Vaginal dryness.  Headaches.  Mood swings.  Depression.  Memory problems.  Irritability.  Tiredness.  Weight gain.  Trouble getting pregnant.  The beginning of losing bone cells (osteoporosis).  The beginning of hardening of the arteries (atherosclerosis). DIAGNOSIS  Your health care provider will make a diagnosis by analyzing your age, menstrual history, and symptoms. He or she will do a physical exam and note any changes in your body, especially your female organs. Female hormone tests may or may not be helpful depending on the amount of female hormones you produce and when you produce them. However, other hormone tests may be helpful to rule out other problems. TREATMENT  In some cases, no treatment is needed. The decision on whether treatment is necessary during the perimenopause should be made by you and your health care provider based on how the symptoms are affecting you and your lifestyle. Various treatments are available, such as:  Treating individual symptoms with a specific medicine for that symptom.  Herbal medicines that can help specific symptoms.  Counseling.  Group therapy. HOME CARE INSTRUCTIONS   Keep track of your menstrual periods (when they occur, how heavy they are, how long between periods, and how long they last) as  well as your symptoms and when they started.  Only take over-the-counter or prescription medicines as directed by your health care provider.  Sleep and rest.  Exercise.  Eat a diet that contains calcium (good for your bones) and soy (acts like the estrogen hormone).  Do not smoke.  Avoid alcoholic beverages.  Take vitamin supplements as recommended by your health care provider. Taking vitamin E may help in certain cases.  Take calcium and vitamin D supplements to help prevent bone loss.  Group therapy is sometimes helpful.  Acupuncture may help in some cases. SEEK MEDICAL CARE IF:   You have questions about any symptoms you are having.  You need a referral to a specialist (gynecologist, psychiatrist, or psychologist). SEEK IMMEDIATE MEDICAL CARE IF:   You have vaginal bleeding.  Your period lasts longer than 8 days.  Your periods are recurring sooner than 21 days.  You have bleeding after intercourse.  You have severe depression.  You have pain when you urinate.  You have severe headaches.  You have vision problems.   This information is not intended to replace advice   given to you by your health care provider. Make sure you discuss any questions you have with your health care provider.   Document Released: 10/10/2004 Document Revised: 09/23/2014 Document Reviewed: 04/01/2013 Elsevier Interactive Patient Education 2016 [employer]  

## 2015-10-03 NOTE — Progress Notes (Signed)
55 y.o. [id number] Married  Caucasian Fe here for annual exam. Periods changing, last period 08/06/15. Has been having regular periods while on OCP, but the past year have been every two months. Denies hot flashes or night sweats. Patient has history of post coital UTI with good response with Macrobid use after sexual activity. Now is having some vaginal dryness ? Or vaginal irritation related to sexual activity. Some redness post coital, no discharge increase or odor for about the past 4 months. Please check. Sees PCP for medication management for anxiety and depression and prn. No other health issues today.          Patient's last menstrual period was 08/06/2015.          Sexually active: Yes.    The current method of family planning is OCP (estrogen/progesterone).    Exercising: Yes.    weights, cardio Smoker:  no  Health Maintenance: Pap:  08-30-14 neg MMG:  08-07-15 birads 1:neg Colonoscopy:  2013 neg f/u 34yrs BMD:   none TDaP:  2007 Shingles: 2016 Pneumonia: no Hep C and HIV: neg maybe 5 yrs Labs: hgb-13.9 Self breast exam: done occ   reports that she has never smoked. She has never used smokeless tobacco. She reports that she drinks about 0.6 - 1.2 oz of alcohol per week. She reports that she does not use illicit drugs.  Past Medical History  Diagnosis Date  . Asthma   . Frequent UTI     post coital  . Positional vertigo   . 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 Shingles     Past Surgical History  Procedure Laterality Date  . Urethral stricture dilatation      Current Outpatient Prescriptions  Medication Sig Dispense Refill  . albuterol (PROAIR HFA) 108 (90 BASE) MCG/ACT inhaler Inhale 2 puffs into the lungs as needed.    Marland Kitchen aspirin-acetaminophen-caffeine (EXCEDRIN MIGRAINE) [id number] MG per tablet Take by mouth as needed for headache.    . Azelaic Acid (FINACEA) 15 % cream MASSAGE A THIN FILM OF GEL INTO THE AFFECTED AREA TWICE DAILY.    Marland Kitchen Bromfenac Sodium  (BROMSITE) 0.075 % SOLN Place 1 drop into the left eye daily for 30 days.    Marland Kitchen buPROPion (WELLBUTRIN XL) 300 MG 24 hr tablet TAKE ONE TABLET (300 MG TOTAL) BY MOUTH EVERY MORNING.    . fexofenadine (ALLEGRA) 180 MG tablet 180 mg.    . LORazepam (ATIVAN) 0.5 MG tablet Take 0.5 mg by mouth as needed.     . meclizine (ANTIVERT) 25 MG tablet Take 25 mg by mouth 3 (three) times daily as needed for dizziness.     . montelukast (SINGULAIR) 10 MG tablet As needed for colds    . naproxen sodium (ALEVE) 220 MG tablet Take 220 mg by mouth.    . nitrofurantoin, macrocrystal-monohydrate, (MACROBID) 100 MG capsule TAKE 1 CAPSULE (100 MG TOTAL) BY MOUTH AS NEEDED. 30 capsule 1  . norethindrone-ethinyl estradiol (JUNEL FE,GILDESS FE,LOESTRIN FE) 1-20 MG-MCG tablet Take 1 tablet by mouth daily. 1 Package 11  . prednisoLONE acetate (PRED FORTE) 1 % ophthalmic suspension One drop right eye six times a day starting two days before eye surgery.BRAND NAME MEDICALLY NECESSARY.DO NOT DISPENSE GENERIC.    Marland Kitchen promethazine (PHENERGAN) 25 MG tablet Take 25 mg by mouth as needed for nausea.     . sodium chloride (MURO 128) 5 % ophthalmic solution 1 drop.    Marland Kitchen escitalopram (LEXAPRO) 5 MG tablet Take  5 mg by mouth. Reported on 10/03/2015     No current facility-administered medications for this visit.    Family History  Problem Relation Age of Onset  . Cancer Mother 60    ovarian  . Hypertension Mother   . Hyperlipidemia Mother   . Prostate cancer Maternal Grandfather   . Hyperlipidemia Father   . ALS Father   . Breast cancer Other     MGGM  . Breast cancer Other     Mother's first cousin  . Breast cancer Other     Maternal great aunt    ROS:  Pertinent items are noted in HPI.  Otherwise, a comprehensive ROS was negative.  Exam:   BP 102/66 mmHg  Pulse 68  Resp 16  Ht 5' 5.75" (1.67 m)  Wt 161 lb (73.029 kg)  BMI 26.19 kg/m2  LMP 08/06/2015 Height: 5' 5.75" (167 cm) Ht Readings from Last 3 Encounters:   10/03/15 5' 5.75" (1.67 m)  11/03/14 5' 5.5" (1.664 m)  08/30/14 5' 5.5" (1.664 m)    General appearance: alert, cooperative and appears stated age Head: Normocephalic, without obvious abnormality, atraumatic Neck: no adenopathy, supple, symmetrical, trachea midline and thyroid normal to inspection and palpation Lungs: clear to auscultation bilaterally Breasts: normal appearance, no masses or tenderness, No nipple retraction or dimpling, No nipple discharge or bleeding, No axillary or supraclavicular adenopathy Heart: regular rate and rhythm Abdomen: soft, non-tender; no masses,  no organomegaly Extremities: extremities normal, atraumatic, no cyanosis or edema Skin: Skin color, texture, turgor normal. No rashes or lesions Lymph nodes: Cervical, supraclavicular, and axillary nodes normal. No abnormal inguinal nodes palpated Neurologic: Grossly normal   Pelvic: External genitalia:  no lesions              Urethra:  normal appearing urethra with no masses, tenderness or lesions              Bartholin's and Skene's: normal                 Vagina: dry appearing vagina with pale color and scant discharge, no lesions, redness at introitus, area shown to patient with mirror              Cervix: normal,non tender, no lesions              Pap taken: No. Bimanual Exam:  Uterus:  normal size, contour, position, consistency, mobility, non-tender              Adnexa: normal adnexa and no mass, fullness, tenderness               Rectovaginal: Confirms               Anus:  normal sphincter tone, no lesions  Chaperone present: yes  A:  Well Woman with normal exam  Perimenopausal on OCP desires discontinuance  History of post coital UTI occurring again needs Rx again for use  Vaginal irritation with sexual activity, R/O vaginal infection  Vaginal dryness  Family history of ovarian cancer(mother) desires lab and PUS again  Screening labs  Immunization update TDAP  Anxiety/depression with MD  management  Migraine history with PCP management   P:   Reviewed health and wellness pertinent to exam  Discussed perimenopause/menopause etiology and expectations. Patient has not had a period on OCP in 2 months, previously regular, discussed this can happen with OCP anytime. Patient plans to stop OCP as discussed due to age. Would like to continue 3  more months and then stop. Patient will advise if no period in 3 months once stops OCP. Has menses calendar with abnormal parameters.  Rx Macrobid see order,   Lab affirm will treat if indicted  Discussed finding of vaginal dryness which increases risk of UTI and vaginal irritation. Discussed coconut oil use for sexual activity and dryness daily with applicator use. Will advise if no improvement.  Lab: Ca 125, PUS, patient will be called with insurance information and scheduled for PUS  Labs:  Hep C  Patient requests TDAP  Continue follow up as indicated.  Pap smear as above not taken   counseled on breast self exam, mammography screening, use and side effects of OCP's, adequate intake of calcium and vitamin D, diet and exercise  return annually or prn  An After Visit Summary was printed and given to the patient.

## 2015-10-03 NOTE — Progress Notes (Signed)
Patient was here for AEX with DL.  She was advised that she was due for her TDap booster.  Patient agreed and was advised on benefits and side effects.  She denies and recent illness or previous complications with injections.    Injection give on Right deltoid and tolerated well.  Patient given instruction on when her next booster will be due.  No other concerns.

## 2015-10-04 ENCOUNTER — Telehealth: Payer: Self-pay | Admitting: Certified Nurse Midwife

## 2015-10-04 LAB — WET PREP BY MOLECULAR PROBE
Candida species: NEGATIVE
Gardnerella vaginalis: NEGATIVE
Trichomonas vaginosis: NEGATIVE

## 2015-10-04 LAB — HEPATITIS C ANTIBODY: HCV Ab: NEGATIVE

## 2015-10-04 LAB — CA 125: CA 125: 15 U/mL (ref <35)

## 2015-10-04 NOTE — Telephone Encounter (Signed)
Spoke with pt regarding benefit for ultrasound. Patient understood and agreeable, however, patient would like to wait until she receives lab results before she schedules the ultrasound. Forwarded this information to clinical nurse for review

## 2015-10-04 NOTE — Telephone Encounter (Signed)
Spoke with patient. Advised of results as seen below from [place]. Patient is agreeable. Patient states that since her CA 125 was normal she does not want to proceed with PUS in the office at this time. This PUS was requested by the patient due to family history of ovarian cancer.   Notes Recorded by Regina Eck, CNM on 10/04/2015 at 1:15 PM Notify patient that Ca 125 is WNL last year was 37, this year 15 Hep. C is negative Vaginal affirm screening was negative for yeast, BV or trichomonas, so feel vaginal dryness is the problem we discussed.  Cc: Lerry Liner  Routing to provider for final review. Patient agreeable to disposition. Will close encounter.

## 2015-10-09 ENCOUNTER — Ambulatory Visit: Payer: BC Managed Care – PPO | Admitting: Certified Nurse Midwife

## 2015-10-09 LAB — HEMOGLOBIN, FINGERSTICK: HEMOGLOBIN, FINGERSTICK: 13.9 g/dL (ref 12.0–16.0)

## 2015-10-09 NOTE — Progress Notes (Signed)
Reviewed personally.  M. Suzanne Jaiyah Beining, MD.  

## 2015-12-13 DIAGNOSIS — H26492 Other secondary cataract, left eye: Secondary | ICD-10-CM | POA: Insufficient documentation

## 2015-12-30 ENCOUNTER — Other Ambulatory Visit: Payer: Self-pay | Admitting: Certified Nurse Midwife

## 2016-01-01 NOTE — Telephone Encounter (Signed)
According to Last AEX 10/03/15 patient was going to continue ocp for three months and then stop it due to her age, please advise.

## 2016-01-05 ENCOUNTER — Other Ambulatory Visit: Payer: Self-pay | Admitting: Certified Nurse Midwife

## 2016-01-05 ENCOUNTER — Telehealth: Payer: Self-pay | Admitting: *Deleted

## 2016-01-05 DIAGNOSIS — N912 Amenorrhea, unspecified: Secondary | ICD-10-CM

## 2016-01-05 NOTE — Telephone Encounter (Signed)
Message left to return call to [place] at [phone].  Detailed message okay per designated party release form.  Advised of message from [place].  Nees lab appointment at her convenience.

## 2016-01-05 NOTE — Telephone Encounter (Signed)
Patient calling wanting to speak with Ms. Debbie's nurse she was told to call our office in 3 months. Best # to reach: [phone]

## 2016-01-05 NOTE — Telephone Encounter (Signed)
I wanted to check her [facility] again prior to Provera use if needed. Because her previous [place] was 7.  Will need this result before treating .Please schedule. Order in. Will not give Provera until lab in.

## 2016-01-05 NOTE — Telephone Encounter (Signed)
Call to patient. She states she stopped her OCP when she was last here for visit with Melvia Heaps CNM. She has not had a cycle since she stopped oral contraception and was advised to call for Provera.  Patient is aware of instructions and to call back with or without cycle two weeks after completing Provera.  Patient is agreeable.  Advised will send message to Melvia Heaps CNM and will return call if any new instructions or changes of plans. Patient agreeable.  Would like Rx to [facility] [place], .   Okay to place order for Provera 10 mg po daily for ten days?

## 2016-01-08 NOTE — Telephone Encounter (Signed)
Patient canceled her appointment for labs for 01/09/16. She states she did not have a cycle for three months and now her cycle has started. She states she will call back if she goes again without having a cycle for three months again.

## 2016-01-08 NOTE — Telephone Encounter (Signed)
Agree with plan 

## 2016-01-08 NOTE — Telephone Encounter (Signed)
Routing to [employer] CNM for review. Anything further needed for this patient at this time?

## 2016-01-09 ENCOUNTER — Other Ambulatory Visit: Payer: BC Managed Care – PPO

## 2016-02-29 DIAGNOSIS — M47816 Spondylosis without myelopathy or radiculopathy, lumbar region: Secondary | ICD-10-CM | POA: Insufficient documentation

## 2016-04-02 DIAGNOSIS — M792 Neuralgia and neuritis, unspecified: Secondary | ICD-10-CM | POA: Insufficient documentation

## 2016-04-18 ENCOUNTER — Other Ambulatory Visit (INDEPENDENT_AMBULATORY_CARE_PROVIDER_SITE_OTHER): Payer: BC Managed Care – PPO

## 2016-04-18 DIAGNOSIS — N912 Amenorrhea, unspecified: Secondary | ICD-10-CM

## 2016-04-18 LAB — TSH: TSH: 1.68 mIU/L

## 2016-04-19 LAB — PROLACTIN: Prolactin: 8.6 ng/mL

## 2016-04-19 LAB — FOLLICLE STIMULATING HORMONE: FSH: 60.1 m[IU]/mL

## 2016-04-24 ENCOUNTER — Other Ambulatory Visit: Payer: Self-pay | Admitting: Certified Nurse Midwife

## 2016-04-24 DIAGNOSIS — N912 Amenorrhea, unspecified: Secondary | ICD-10-CM

## 2016-04-24 MED ORDER — MEDROXYPROGESTERONE ACETATE 10 MG PO TABS: 10.0000 mg | ORAL_TABLET | Freq: Every day | ORAL | 0 refills | Status: DC

## 2016-05-18 ENCOUNTER — Other Ambulatory Visit: Payer: Self-pay | Admitting: Certified Nurse Midwife

## 2016-05-21 NOTE — Telephone Encounter (Signed)
Medication refill request: Junel  Last AEX:  10/03/15 DL Next AEX: 10/10/16 DL Last MMG (if hormonal medication request): 08/07/15 [id number]  Refill authorized: 01/02/16 #[id number]. Today please advise.

## 2016-05-21 NOTE — Telephone Encounter (Signed)
Patient wants to speak with the nurse no information given. °

## 2016-05-23 ENCOUNTER — Telehealth: Payer: Self-pay | Admitting: Certified Nurse Midwife

## 2016-05-23 NOTE — Telephone Encounter (Signed)
Agreeable with plan

## 2016-05-23 NOTE — Telephone Encounter (Signed)
Message noted from Jill Cross, CNM. Left message per DPR for patient that [name] was in agreement with plan and we would wait to hear from her in 3 months.   Routing to provider for final review. Patient agreeable to disposition. Will close encounter.

## 2016-05-23 NOTE — Telephone Encounter (Signed)
Patient called to give update to nurse after taking a medication. Please see last refill note.

## 2016-05-23 NOTE — Telephone Encounter (Signed)
See 05/23/16 Telephone encounter

## 2016-05-23 NOTE — Telephone Encounter (Signed)
Returned call to patient. Patient states that she was told to call and give an update about the progesterone. Patient states she never started taking the progesterone because she was "already taking a lot of medication and didn't want to put too much in her body." She states she was going to start the progesterone earlier this week, but just before she started taking, she got her period again. Patient states "the countdown starts again and I will call back in 3 months with another update." RN advised this message would be sent to Melvia Heaps, CNM and our office would call with any additional recommendations.  Routing to provider for review.

## 2016-05-28 DIAGNOSIS — M5136 Other intervertebral disc degeneration, lumbar region: Secondary | ICD-10-CM | POA: Insufficient documentation

## 2016-06-25 DIAGNOSIS — F411 Generalized anxiety disorder: Secondary | ICD-10-CM | POA: Insufficient documentation

## 2016-07-31 ENCOUNTER — Telehealth: Payer: Self-pay | Admitting: Certified Nurse Midwife

## 2016-08-19 ENCOUNTER — Other Ambulatory Visit: Payer: Self-pay | Admitting: Certified Nurse Midwife

## 2016-08-19 DIAGNOSIS — Z1231 Encounter for screening mammogram for malignant neoplasm of breast: Secondary | ICD-10-CM

## 2016-08-26 NOTE — Telephone Encounter (Signed)
error 

## 2016-09-20 ENCOUNTER — Ambulatory Visit
Admission: RE | Admit: 2016-09-20 | Discharge: 2016-09-20 | Disposition: A | Discharge status: Home / Self Care | Payer: BC Managed Care – PPO | Source: Ambulatory Visit | Attending: Certified Nurse Midwife | Admitting: Certified Nurse Midwife

## 2016-09-20 DIAGNOSIS — Z1231 Encounter for screening mammogram for malignant neoplasm of breast: Secondary | ICD-10-CM

## 2016-10-10 ENCOUNTER — Ambulatory Visit (INDEPENDENT_AMBULATORY_CARE_PROVIDER_SITE_OTHER): Payer: BC Managed Care – PPO | Admitting: Certified Nurse Midwife

## 2016-10-10 ENCOUNTER — Encounter: Payer: Self-pay | Admitting: Certified Nurse Midwife

## 2016-10-10 ENCOUNTER — Other Ambulatory Visit: Payer: Self-pay | Admitting: Certified Nurse Midwife

## 2016-10-10 VITALS — BP 104/64 | HR 68 | Resp 16 | Ht 65.5 in | Wt 155.0 lb

## 2016-10-10 DIAGNOSIS — Z8041 Family history of malignant neoplasm of ovary: Secondary | ICD-10-CM

## 2016-10-10 DIAGNOSIS — Z0000 Encounter for general adult medical examination without abnormal findings: Secondary | ICD-10-CM | POA: Diagnosis not present

## 2016-10-10 DIAGNOSIS — Z01419 Encounter for gynecological examination (general) (routine) without abnormal findings: Secondary | ICD-10-CM | POA: Diagnosis not present

## 2016-10-10 DIAGNOSIS — Z124 Encounter for screening for malignant neoplasm of cervix: Secondary | ICD-10-CM

## 2016-10-10 LAB — POCT URINALYSIS DIPSTICK
BILIRUBIN UA: NEGATIVE
Glucose, UA: NEGATIVE
KETONES UA: NEGATIVE
Leukocytes, UA: NEGATIVE
Nitrite, UA: NEGATIVE
PH UA: 5
PROTEIN UA: NEGATIVE
RBC UA: NEGATIVE
Urobilinogen, UA: NEGATIVE

## 2016-10-10 LAB — COMPREHENSIVE METABOLIC PANEL
ALT: 16 U/L (ref 6–29)
AST: 20 U/L (ref 10–35)
Albumin: 4.3 g/dL (ref 3.6–5.1)
Alkaline Phosphatase: 65 U/L (ref 33–130)
BUN: 14 mg/dL (ref 7–25)
CALCIUM: 9.5 mg/dL (ref 8.6–10.4)
CHLORIDE: 101 mmol/L (ref 98–110)
CO2: 28 mmol/L (ref 20–31)
Creat: 0.7 mg/dL (ref 0.50–1.05)
GLUCOSE: 122 mg/dL — AB (ref 65–99)
POTASSIUM: 4.8 mmol/L (ref 3.5–5.3)
Sodium: 137 mmol/L (ref 135–146)
Total Bilirubin: 0.4 mg/dL (ref 0.2–1.2)
Total Protein: 7.1 g/dL (ref 6.1–8.1)

## 2016-10-10 LAB — CBC
HCT: 42.7 % (ref 35.0–45.0)
Hemoglobin: 14.5 g/dL (ref 11.7–15.5)
MCH: 30.9 pg (ref 27.0–33.0)
MCHC: 34 g/dL (ref 32.0–36.0)
MCV: 91 fL (ref 80.0–100.0)
MPV: 10.4 fL (ref 7.5–12.5)
PLATELETS: 258 10*3/uL (ref 140–400)
RBC: 4.69 MIL/uL (ref 3.80–5.10)
RDW: 12.6 % (ref 11.0–15.0)
WBC: 6.8 10*3/uL (ref 3.8–10.8)

## 2016-10-10 LAB — LIPID PANEL
Cholesterol: 184 mg/dL (ref <200)
HDL: 86 mg/dL (ref >50)
LDL CALC: 85 mg/dL (ref <100)
Total CHOL/HDL Ratio: 2.1 Ratio (ref <5.0)
Triglycerides: 66 mg/dL (ref <150)
VLDL: 13 mg/dL (ref <30)

## 2016-10-10 LAB — TSH: TSH: 1.36 m[IU]/L

## 2016-10-10 NOTE — Progress Notes (Signed)
56 y.o. G4P2002 Married  Caucasian Fe here for annual exam.  Menopausal no HRT. Denies  vaginal dryness, using coconut oil with good response. Still having periods every other month.. Has Progesterone to take if goes 3 months..Seeing new Psychiatrist now for anxiety. Spouse now traveling out of town for long period of time. Now on Cymbalta, but still adjusting to medication. Had one episode of not being oriented, but feeling better now. Seeing Rachell Cipro and plans to let her know. Plans to have CA125 and PUS this year. Screening labs desired.  Now living in [place] with new puppy who is also helping her with anxiety. No other health issues today. Mickel Baas is getting married and Animal nutritionist college! ( I delivered her daughters).  Patient's last menstrual period was 09/09/2016 (exact date).          Sexually active: Yes.    The current method of family planning is none.    Exercising: Yes.    walking Smoker:  no  Health Maintenance: Pap:  08-30-14 neg  MMG:  09-20-16 category c density birads 1:neg Colonoscopy:  2013 f/u 74yrs BMD:   none TDaP:  2017 Shingles: 2016 Pneumonia: had done Hep C and HIV: had done neg per patient Labs: poct urine-neg Self breast exam: done occ   reports that she has never smoked. She has never used smokeless tobacco. She reports that she does not drink alcohol or use drugs.  Past Medical History:  Diagnosis Date  . Anxiety   . Asthma   . Back pain   . Frequent UTI    post coital  . 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 Lumbar herniated disc   . Positional vertigo   . Shingles   . Shingles     Past Surgical History:  Procedure Laterality Date  . corneal dsaek     both eyes  . URETHRAL STRICTURE DILATATION      Current Outpatient Prescriptions  Medication Sig Dispense Refill  . Azelaic Acid (FINACEA) 15 % cream MASSAGE A THIN FILM OF GEL INTO THE AFFECTED AREA TWICE DAILY.    . DULoxetine (CYMBALTA) 60 MG capsule     .  LORazepam (ATIVAN) 0.5 MG tablet Take 0.5 mg by mouth as needed for anxiety.    . meclizine (ANTIVERT) 25 MG tablet Take 25 mg by mouth 3 (three) times daily as needed for dizziness.     . montelukast (SINGULAIR) 10 MG tablet As needed for colds    . promethazine (PHENERGAN) 25 MG tablet Take 25 mg by mouth as needed for nausea.      No current facility-administered medications for this visit.     Family History  Problem Relation Age of Onset  . Cancer Mother 77    ovarian  . Hypertension Mother   . Hyperlipidemia Mother   . Hyperlipidemia Father   . ALS Father   . Prostate cancer Maternal Grandfather   . Breast cancer Other     MGGM  . Breast cancer Other     Mother's first cousin  . Breast cancer Other     Maternal great aunt    ROS:  Pertinent items are noted in HPI.  Otherwise, a comprehensive ROS was negative.  Exam:   BP 104/64   Pulse 68   Resp 16   Ht 5' 5.5" (1.664 m)   Wt 155 lb (70.3 kg)   LMP 09/09/2016 (Exact Date)   BMI 25.40 kg/m  Height: 5' 5.5" (166.4  cm) Ht Readings from Last 3 Encounters:  10/10/16 5' 5.5" (1.664 m)  10/03/15 5' 5.75" (1.67 m)  11/03/14 5' 5.5" (1.664 m)    General appearance: alert, cooperative and appears stated age Head: Normocephalic, without obvious abnormality, atraumatic Neck: no adenopathy, supple, symmetrical, trachea midline and thyroid normal to inspection and palpation Lungs: clear to auscultation bilaterally Breasts: normal appearance, no masses or tenderness, No nipple retraction or dimpling, No nipple discharge or bleeding, No axillary or supraclavicular adenopathy Heart: regular rate and rhythm Abdomen: soft, non-tender; no masses,  no organomegaly Extremities: extremities normal, atraumatic, no cyanosis or edema Skin: Skin color, texture, turgor normal. No rashes or lesions Lymph nodes: Cervical, supraclavicular, and axillary nodes normal. No abnormal inguinal nodes palpated Neurologic: Grossly  normal   Pelvic: External genitalia:  no lesions              Urethra:  normal appearing urethra with no masses, tenderness or lesions              Bartholin's and Skene's: normal                 Vagina: normal appearing vagina with normal color and discharge, no lesions              Cervix: multiparous appearance, no bleeding following Pap and no cervical motion tenderness              Pap taken: Yes.   Bimanual Exam:  Uterus:  normal size, contour, position, consistency, mobility, non-tender              Adnexa: normal adnexa and no mass, fullness, tenderness               Rectovaginal: Confirms               Anus:  normal sphincter tone, no lesions  Chaperone present: yes  A:  Well Woman with normal exam  Contraception none needed per patient  Perimenopausal with cycle changes  Anxiety with PCP management  Family history of Ovarian Cancer mother  Screening labs   P:   Reviewed health and wellness pertinent to exam  Discussed importance of keeping menses calendar and advise if no period in 3 months or other changes.  Continue follow up with PCP as indicated.  Patient will be called with insurance infor regarding PUS due to family history of ovarian cancer and scheduled  Lab: Ca125  Labs:Lipid panel, CMP, Vitamin D, TSH.CBC  Pap smear as above with HPVHR   counseled on breast self exam, mammography screening, menopause, adequate intake of calcium and vitamin D, diet and exercise  return annually or prn  An After Visit Summary was printed and given to the patient.

## 2016-10-10 NOTE — Patient Instructions (Signed)

## 2016-10-11 LAB — CA 125: CA 125: 16 U/mL (ref <35)

## 2016-10-11 LAB — VITAMIN D 25 HYDROXY (VIT D DEFICIENCY, FRACTURES): Vit D, 25-Hydroxy: 37 ng/mL (ref 30–100)

## 2016-10-11 LAB — IPS PAP TEST WITH HPV

## 2016-10-12 LAB — HEMOGLOBIN A1C
HEMOGLOBIN A1C: 5.1 % (ref <5.7)
MEAN PLASMA GLUCOSE: 100 mg/dL

## 2016-10-13 NOTE — Progress Notes (Signed)
Reviewed personally.  M. Suzanne Jamarria Real, MD.  

## 2016-10-14 ENCOUNTER — Telehealth: Payer: Self-pay | Admitting: Certified Nurse Midwife

## 2016-10-14 NOTE — Telephone Encounter (Signed)
Patient returned call. Review benefit for ultrasound. Patient understood and agreeable. Patient ready to schedule. Patient scheduled 10/17/16 with Dr Sabra Heck. Patient aware of  Date, arrival time and cancellation policy. No further questions. Ok to close

## 2016-10-14 NOTE — Telephone Encounter (Signed)
Called patient to review benefits for a recommended ultrasound. Left Voicemail requesting a call back. °

## 2016-10-17 ENCOUNTER — Encounter: Payer: Self-pay | Admitting: Obstetrics & Gynecology

## 2016-10-17 ENCOUNTER — Ambulatory Visit (INDEPENDENT_AMBULATORY_CARE_PROVIDER_SITE_OTHER): Payer: BC Managed Care – PPO

## 2016-10-17 ENCOUNTER — Other Ambulatory Visit: Payer: Self-pay

## 2016-10-17 ENCOUNTER — Ambulatory Visit (INDEPENDENT_AMBULATORY_CARE_PROVIDER_SITE_OTHER): Payer: BC Managed Care – PPO | Admitting: Obstetrics & Gynecology

## 2016-10-17 VITALS — BP 100/60 | HR 70 | Resp 14 | Ht 65.5 in | Wt 155.0 lb

## 2016-10-17 DIAGNOSIS — D252 Subserosal leiomyoma of uterus: Secondary | ICD-10-CM | POA: Diagnosis not present

## 2016-10-17 DIAGNOSIS — Z8041 Family history of malignant neoplasm of ovary: Secondary | ICD-10-CM

## 2016-10-17 DIAGNOSIS — D251 Intramural leiomyoma of uterus: Secondary | ICD-10-CM | POA: Diagnosis not present

## 2016-10-17 NOTE — Progress Notes (Signed)
56 y.o. G41P2002 Married Caucasian female here for pelvic ultrasound due to family hx of ovarian cancer.  Her mother died from the disease.  She has an ultrasound every other year and has had a ca-125 the last few years.  Most recently this was 16 on 10/10/16.  She is still having menstrual cycles but less frequently this past year.  Patient's last menstrual period was 08/30/2016.  Contraception: none  Findings:  UTERUS: 7.8 x 4.5 x 4.2cm with three fibroids measuring 2.1cm, 0.8cm, and 1.4cm (a little decreased from 2016 except for the largest one) EMS: 5.51mm and symmetric ADNEXA: Left ovary: 2.0 x 1.7 x 0.8cm       Right ovary: 2.2 x 1.9 x 0.9cm CUL DE SAC: no free fliud  Discussion:  Findings reviewed.  Images reviewed.  D/W pt strengths and weaknesses in ca-125.  She continues to desire imaging and tumor marker testing  Will see [name] Hollice Espy in one year.  Assessment:  Uterine fibroids Family hx of ovarian cancer in her mother  Plan:  Return for AEX Will continue PUS every other year and ca-125 yearly  ~15 minutes spent with patient >50% of time was in face to face discussion of above.

## 2016-10-21 ENCOUNTER — Other Ambulatory Visit: Payer: Self-pay

## 2016-10-21 NOTE — Telephone Encounter (Signed)
Medication refill request: MACROBID Last AEX:  10/10/16 DL Next AEX: 10/15/17 Last MMG (if hormonal medication request): 09/20/16 BIRADS 1 negative Refill authorized: patient would like a refill on Macrobid. States that she takes this after sex. Refill expired and pharmacy needs new rx. Please advise

## 2016-10-22 MED ORDER — NITROFURANTOIN MONOHYD MACRO 100 MG PO CAPS: 100.0000 mg | ORAL_CAPSULE | Freq: Two times a day (BID) | ORAL | 3 refills | Status: DC

## 2016-10-29 NOTE — Progress Notes (Signed)
This was noted in her former paper chart

## 2017-05-09 ENCOUNTER — Telehealth: Payer: Self-pay | Admitting: Certified Nurse Midwife

## 2017-05-09 NOTE — Telephone Encounter (Signed)
Reviewed with Melvia Heaps, CNM, clarified patient has already taken Provera. Will continue to monitor menses, call office with any future bleeding. Will f/u at next AEX 10/15/17.  Call to patient, left detailed message, ok per current dpr. Advised patient to continue to monitor menses, call with any future bleeding, will f/u at next AEX in 09/2017. Return call to office at [phone] with any additional questions.    Routing to provider for final review. Patient is agreeable to disposition. Will close encounter.

## 2017-05-09 NOTE — Telephone Encounter (Signed)
Monitor for next 3 months if no bleeding advise. Do no take Provera until will decide if no menses.

## 2017-05-09 NOTE — Telephone Encounter (Signed)
Spoke with patient, calling with provera update:  1. 04/18/16 FSH in menopausal range, was advised to take  Provera 10mg  x 10days after 3 months with no menses.   2. No menses May, June or July 2018, took Provera in August, reports no bleeding.   Advised patient would update Melvia Heaps, CNM and return call with recommendations, patient is agreeable.  Melvia Heaps, CNM -please review and advise?

## 2017-05-09 NOTE — Telephone Encounter (Signed)
Patient states she was prescribed progesterone and needs to give an update on how she did.  No other information given.

## 2017-08-18 ENCOUNTER — Other Ambulatory Visit: Payer: Self-pay | Admitting: Certified Nurse Midwife

## 2017-08-18 DIAGNOSIS — Z1231 Encounter for screening mammogram for malignant neoplasm of breast: Secondary | ICD-10-CM

## 2017-09-23 ENCOUNTER — Ambulatory Visit
Admission: RE | Admit: 2017-09-23 | Discharge: 2017-09-23 | Disposition: A | Discharge status: Home / Self Care | Payer: BC Managed Care – PPO | Source: Ambulatory Visit | Attending: Certified Nurse Midwife | Admitting: Certified Nurse Midwife

## 2017-09-23 DIAGNOSIS — Z1231 Encounter for screening mammogram for malignant neoplasm of breast: Secondary | ICD-10-CM

## 2017-09-24 ENCOUNTER — Other Ambulatory Visit: Payer: Self-pay | Admitting: Certified Nurse Midwife

## 2017-09-24 DIAGNOSIS — R928 Other abnormal and inconclusive findings on diagnostic imaging of breast: Secondary | ICD-10-CM

## 2017-09-29 ENCOUNTER — Ambulatory Visit
Admission: RE | Admit: 2017-09-29 | Discharge: 2017-09-29 | Disposition: A | Discharge status: Home / Self Care | Payer: BC Managed Care – PPO | Source: Ambulatory Visit | Attending: Certified Nurse Midwife | Admitting: Certified Nurse Midwife

## 2017-09-29 ENCOUNTER — Ambulatory Visit: Payer: BC Managed Care – PPO

## 2017-09-29 DIAGNOSIS — R928 Other abnormal and inconclusive findings on diagnostic imaging of breast: Secondary | ICD-10-CM

## 2017-10-07 ENCOUNTER — Other Ambulatory Visit: Payer: Self-pay

## 2017-10-07 ENCOUNTER — Telehealth: Payer: Self-pay | Admitting: Certified Nurse Midwife

## 2017-10-07 ENCOUNTER — Encounter: Payer: Self-pay | Admitting: Certified Nurse Midwife

## 2017-10-07 ENCOUNTER — Ambulatory Visit: Payer: BC Managed Care – PPO | Admitting: Certified Nurse Midwife

## 2017-10-07 VITALS — BP 112/78 | HR 80 | Temp 97.9°F | Resp 16 | Wt 148.0 lb

## 2017-10-07 DIAGNOSIS — B373 Candidiasis of vulva and vagina: Secondary | ICD-10-CM

## 2017-10-07 DIAGNOSIS — R350 Frequency of micturition: Secondary | ICD-10-CM

## 2017-10-07 DIAGNOSIS — N898 Other specified noninflammatory disorders of vagina: Secondary | ICD-10-CM

## 2017-10-07 DIAGNOSIS — B3731 Acute candidiasis of vulva and vagina: Secondary | ICD-10-CM

## 2017-10-07 LAB — POCT URINALYSIS DIPSTICK
Appearance: NORMAL
Bilirubin, UA: NEGATIVE
Glucose, UA: NEGATIVE
KETONES UA: NEGATIVE
NITRITE UA: NEGATIVE
PROTEIN UA: NEGATIVE
UROBILINOGEN UA: 0.2 U/dL
pH, UA: 6 (ref 5.0–8.0)

## 2017-10-07 MED ORDER — NYSTATIN-TRIAMCINOLONE 100000-0.1 UNIT/GM-% EX OINT: 1.0000 "application " | TOPICAL_OINTMENT | Freq: Two times a day (BID) | CUTANEOUS | 0 refills | Status: DC

## 2017-10-07 NOTE — Progress Notes (Signed)
57 y.o. Married Caucasian female G2P2002 here with complaint of vaginal symptoms of itching, burning at times external only, and no increase in  discharge. Describes discharge as scant white. Known vaginal dryness with coconut oil use with good results. "This feels different and is on the labia and near rectum. History of hemorrhoids, but no issues at present.Onset of symptoms 7 days ago. Used daughters soap and noted some change, but not sure if this is the problem. No STD concerns. Urinary symptoms frequency at times but drinks water frequently. Some burning when urine touches skin only. Contraception :menopausal.  ROS Pertinent to above  O:Healthy female WDWN Affect: normal, orientation x 3  Exam:Skin: warm and dry CVAT bilateral negative Abdomen:soft, non  Tender, suprapubic not tender Inguinal Lymph nodes: no enlargement or tenderness Pelvic exam: External genital: normal female with scaling and scant exudate noted in creases of vulva, wet prep taken BUS: negative Vagina: white scant discharge noted.  Affirm taken Cervix: normal, non tender, no CMT Uterus: normal, non tender Adnexa:normal, non tender, no masses or fullness noted Rectum: hemorrhoidal tag, no thrombosis with irritated appearance  Wet Prep results: External only KOH, Saline + yeast   A:Normal pelvic exam Menopausal vaginal dryness with coconut oil use with good results R/O vaginal infection Yeast vulvitis Hemorrhoid history R/O UTI   P:Discussed findings of yeast vulvitis and etiology. Discussed Aveeno or baking soda sitz bath for comfort. Avoid moist clothes extended period of time. If working out in gym clothes  for long periods of time change underwear   if possible. Coconut Oil use for skin protection prior to activity can be used to external skin for protection or dryness. Rx: Mycolog ointment see order with instructions Lab : Affirm will treat if indicated Lab: Urine micro  Rv prn

## 2017-10-07 NOTE — Patient Instructions (Signed)

## 2017-10-07 NOTE — Telephone Encounter (Signed)
Left message to call Kaitlyn at [phone]. 

## 2017-10-07 NOTE — Telephone Encounter (Signed)
Patient called requesting an appointment today for a possible "vaginal infection." She normally sees Melvia Heaps, CNM. Routing to triage for further assessment and scheduling.

## 2017-10-07 NOTE — Telephone Encounter (Signed)
Spoke with patient. Appointment scheduled for today at 12:45 pm with Melvia Heaps CNM. Patient is agreeable to date and time.  Routing to provider for final review. Patient agreeable to disposition. Will close encounter.

## 2017-10-07 NOTE — Telephone Encounter (Signed)
Spoke with patient. Patient states that for 3-4 days she has been having vaginal soreness. Thought this was related to dryness. Work up this morning and soreness is worse and is "spreading." "I am unable to describe it other than that. It is getting worse and I think I may need an antibiotic." Does not feel she can wait until tomorrow to be seen. Requests to see Jill Cross CNM. Advised will need to review scheduling with Jill Cross CNM and return call. Patient is agreeable.

## 2017-10-08 LAB — URINALYSIS, MICROSCOPIC ONLY
Bacteria, UA: NONE SEEN
CASTS: NONE SEEN /LPF
Epithelial Cells (non renal): NONE SEEN /hpf (ref 0–10)

## 2017-10-08 LAB — VAGINITIS/VAGINOSIS, DNA PROBE
Candida Species: NEGATIVE
GARDNERELLA VAGINALIS: NEGATIVE
TRICHOMONAS VAG: NEGATIVE

## 2017-10-14 ENCOUNTER — Ambulatory Visit: Payer: BC Managed Care – PPO | Admitting: Certified Nurse Midwife

## 2017-10-14 ENCOUNTER — Encounter: Payer: Self-pay | Admitting: Certified Nurse Midwife

## 2017-10-14 ENCOUNTER — Other Ambulatory Visit: Payer: Self-pay

## 2017-10-14 VITALS — BP 100/62 | Temp 97.7°F | Ht 65.5 in | Wt 146.0 lb

## 2017-10-14 DIAGNOSIS — N898 Other specified noninflammatory disorders of vagina: Secondary | ICD-10-CM | POA: Diagnosis not present

## 2017-10-14 DIAGNOSIS — Z01419 Encounter for gynecological examination (general) (routine) without abnormal findings: Secondary | ICD-10-CM

## 2017-10-14 DIAGNOSIS — Z8041 Family history of malignant neoplasm of ovary: Secondary | ICD-10-CM | POA: Diagnosis not present

## 2017-10-14 DIAGNOSIS — Z0000 Encounter for general adult medical examination without abnormal findings: Secondary | ICD-10-CM | POA: Diagnosis not present

## 2017-10-14 DIAGNOSIS — N763 Subacute and chronic vulvitis: Secondary | ICD-10-CM

## 2017-10-14 LAB — POCT URINALYSIS DIPSTICK
BILIRUBIN UA: NEGATIVE
GLUCOSE UA: NEGATIVE
Ketones, UA: NEGATIVE
Nitrite, UA: NEGATIVE
PH UA: 5 (ref 5.0–8.0)
Protein, UA: NEGATIVE
RBC UA: NEGATIVE
Urobilinogen, UA: NEGATIVE E.U./dL — AB

## 2017-10-14 MED ORDER — FLUCONAZOLE 150 MG PO TABS: ORAL_TABLET | ORAL | 0 refills | Status: DC

## 2017-10-14 NOTE — Progress Notes (Signed)
57 y.o. G11P2002 Married  Caucasian Fe here for annual exam. Took Provera challenge, with no withdrawal bleeding. No period since 09/09/16. Patient having night sweats and some hot flashes. Has been taking Gabapentin for nerve issue and this has also relieved her hot flashes. Still having vaginal itching with yeast vulvitis and urinary frequency. History of bladder yeast in past. Sees Dr. Ernie Hew PCP for hypothyroid, asthma, Vitamin D and labs. Patient used Mycolog ointment for yeast vulvitis, from last visit one week ago. Some relief but still present.. Patient brought her Lovie Macadamia with her, she had  used several years ago with same symptoms. Request use again if felt warranted. (I had applied this previously for her with good results). Feels like the same problem she had a few years ago and this and Diflucan worked well for symptoms. No other health issues today.   Patient's last menstrual period was 09/09/2016 (exact date).          Sexually active: Yes.    The current method of family planning is post menopausal status.    Exercising: Yes.    walk, weight machines Smoker:  no  Health Maintenance: Pap:  08-30-14 neg, 10-10-16 neg HPV HR neg History of Abnormal Pap: no MMG:  1/19 bilateral & left breast category c density birads 1:neg Self Breast exams: no Colonoscopy:  2013 f/u 22yrs BMD:   none TDaP:  2017 Shingles: 2015 Pneumonia: 2015 Hep C and HIV:  Hep c neg 2017 Labs: poct urine- wbc 1+   reports that  has never smoked. she has never used smokeless tobacco. She reports that she drinks about 0.6 oz of alcohol per week. She reports that she does not use drugs.  Past Medical History:  Diagnosis Date  . Anxiety   . Asthma   . Back pain   . Frequent UTI    post coital  . 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 Hypothyroidism   . Lumbar herniated disc   . Positional vertigo   . Shingles   . Shingles     Past Surgical History:  Procedure Laterality Date  . corneal  dsaek     both eyes  . URETHRAL STRICTURE DILATATION      Current Outpatient Medications  Medication Sig Dispense Refill  . albuterol (PROVENTIL HFA) 108 (90 Base) MCG/ACT inhaler Inhale into the lungs.    . Azelaic Acid (FINACEA) 15 % cream MASSAGE A THIN FILM OF GEL INTO THE AFFECTED AREA TWICE DAILY.    Marland Kitchen buPROPion (WELLBUTRIN XL) 300 MG 24 hr tablet Take by mouth daily.    Marland Kitchen gabapentin (NEURONTIN) 100 MG capsule Take 100 mg by mouth 3 (three) times daily.    Marland Kitchen levocetirizine (XYZAL) 5 MG tablet every evening.  5  . levothyroxine (SYNTHROID, LEVOTHROID) 25 MCG tablet TAKE 1 TABLET BY MOUTH EVERY DAY IN THE MORNING ON EMPTY STOMACH  0  . LORazepam (ATIVAN) 0.5 MG tablet Take 0.5 mg by mouth as needed for anxiety.    . meclizine (ANTIVERT) 25 MG tablet Take 25 mg by mouth 3 (three) times daily as needed for dizziness.     . montelukast (SINGULAIR) 10 MG tablet As needed for colds    . nitrofurantoin, macrocrystal-monohydrate, (MACROBID) 100 MG capsule Take 1 capsule (100 mg total) by mouth 2 (two) times daily. Only for use post coital 30 capsule 3  . promethazine (PHENERGAN) 25 MG tablet Take 25 mg by mouth as needed for nausea.     Marland Kitchen  Vitamin D, Cholecalciferol, 1000 units CAPS Take by mouth.     No current facility-administered medications for this visit.     Family History  Problem Relation Age of Onset  . Cancer Mother 84       ovarian  . Hypertension Mother   . Hyperlipidemia Mother   . Hyperlipidemia Father   . ALS Father   . Prostate cancer Maternal Grandfather   . Breast cancer Other        MGGM  . Breast cancer Other        Mother's first cousin  . Breast cancer Other        Maternal great aunt    ROS:  Pertinent items are noted in HPI.  Otherwise, a comprehensive ROS was negative.  Exam:   BP 100/62   Temp 97.7 F (36.5 C) (Oral)   Ht 5' 5.5" (1.664 m)   Wt 146 lb (66.2 kg)   LMP 09/09/2016 (Exact Date)   BMI 23.93 kg/m  Height: 5' 5.5" (166.4 cm) Ht  Readings from Last 3 Encounters:  10/14/17 5' 5.5" (1.664 m)  10/17/16 5' 5.5" (1.664 m)  10/10/16 5' 5.5" (1.664 m)    General appearance: alert, cooperative and appears stated age Head: Normocephalic, without obvious abnormality, atraumatic Neck: no adenopathy, supple, symmetrical, trachea midline and thyroid normal to inspection and palpation Lungs: clear to auscultation bilaterally Breasts: normal appearance, no masses or tenderness, No nipple retraction or dimpling, No nipple discharge or bleeding, No axillary or supraclavicular adenopathy Heart: regular rate and rhythm Abdomen: soft, non-tender; no masses,  no organomegaly Extremities: extremities normal, atraumatic, no cyanosis or edema Skin: Skin color, texture, turgor normal. No rashes or lesions Lymph nodes: Cervical, supraclavicular, and axillary nodes normal. No abnormal inguinal nodes palpated Neurologic: Grossly normal   Pelvic: External genitalia:  no lesions              Urethra:  normal appearing urethra with no masses, tenderness or lesions              Bartholin's and Skene's: normal                 Vagina: normal appearing vagina with normal color and discharge, no lesions              Cervix: multiparous appearance, no cervical motion tenderness and no lesions              Pap taken: No. Bimanual Exam:  Uterus:  normal size, contour, position, consistency, mobility, non-tender              Adnexa: normal adnexa and no mass, fullness, tenderness               Rectovaginal: Confirms               Anus:  normal sphincter tone, no lesions  Chaperone present: yes  A:  Well Woman with normal exam  Contraception Menopausal  History of chronic yeast vulvitis with bladder involvement, recent treatment with Mycolog with some relief,but not resolved. Request Gentian Violet use again  Vaginal dryness using coconut oil with good success  Hypothyroid, Asthma, Vitamin D deficiency with PCP management  Family history of  Ovarian cancer mother  Patient genetic screening negative  Requests Ca 125 and PUS again  P:   Reviewed health and wellness pertinent to exam  Discussed importance of advising if vaginal bleeding.  Discussed risks/benefits with use and expectations, patient request Lovie Macadamia use. Vulva  area and vaginal entrance and around urinary meatus painted with Genetian violet. Instructions for home care given. May rinse area in 2-3 hours. Shower in am. Will advise if problems with use or symptoms do resolve. Questions addressed.  Rx Diflucan see order with instructions  Continue follow up with PCP as indicated.  Lab Ca125  Patient will be called with insurance information and scheduled for PUS  Pap smear: no  counseled on breast self exam, mammography screening, feminine hygiene, menopause, adequate intake of calcium and vitamin D, diet and exercise  return annually or prn  An After Visit Summary was printed and given to the patient.

## 2017-10-14 NOTE — Patient Instructions (Signed)

## 2017-10-15 ENCOUNTER — Telehealth: Payer: Self-pay | Admitting: Certified Nurse Midwife

## 2017-10-15 ENCOUNTER — Ambulatory Visit: Payer: BC Managed Care – PPO | Admitting: Certified Nurse Midwife

## 2017-10-15 LAB — CA 125: CANCER ANTIGEN (CA) 125: 17.5 U/mL (ref 0.0–38.1)

## 2017-10-15 NOTE — Telephone Encounter (Signed)
Call placed to patient to review benefits and schedule recommended ultrasound. Left voicemail message requesting a return call °

## 2017-10-16 NOTE — Telephone Encounter (Signed)
Patient is returning a call to [name]. Patient is aware Jill Cross is out of the office and will not receive a call until Monday. Patient declined to speak with triage to schedule the ultrasound, will wait for [name].

## 2017-10-20 NOTE — Telephone Encounter (Signed)
Returned call to patient to review benefits and schedule recommended ultrasound sound. Left voicemail message requesting a return call

## 2017-10-20 NOTE — Telephone Encounter (Signed)
Patient returned call. Spoke with patient regarding benefit for recommended ultrasound. Patient understood and agreeable. Patient ready to schedule. Patient scheduled 10/30/17 with Dr Sabra Heck. Earlier appointment dates were offer, but patient declined. Patient aware of appointment date, arrival time and cancellation policy. No further questions. Ok to close   cc: Dr Sabra Heck  cc: Melvia Heaps, CNM

## 2017-10-21 ENCOUNTER — Other Ambulatory Visit: Payer: Self-pay | Admitting: Orthopedic Surgery

## 2017-10-21 DIAGNOSIS — R52 Pain, unspecified: Secondary | ICD-10-CM

## 2017-10-25 ENCOUNTER — Ambulatory Visit
Admission: RE | Admit: 2017-10-25 | Discharge: 2017-10-25 | Disposition: A | Discharge status: Home / Self Care | Payer: BC Managed Care – PPO | Source: Ambulatory Visit | Attending: Orthopedic Surgery | Admitting: Orthopedic Surgery

## 2017-10-25 DIAGNOSIS — R52 Pain, unspecified: Secondary | ICD-10-CM

## 2017-10-30 ENCOUNTER — Ambulatory Visit: Payer: BC Managed Care – PPO | Admitting: Obstetrics & Gynecology

## 2017-10-30 ENCOUNTER — Other Ambulatory Visit: Payer: Self-pay | Admitting: *Deleted

## 2017-10-30 ENCOUNTER — Ambulatory Visit (INDEPENDENT_AMBULATORY_CARE_PROVIDER_SITE_OTHER): Payer: BC Managed Care – PPO

## 2017-10-30 VITALS — BP 110/68 | HR 72 | Resp 16 | Wt 144.4 lb

## 2017-10-30 DIAGNOSIS — D251 Intramural leiomyoma of uterus: Secondary | ICD-10-CM

## 2017-10-30 DIAGNOSIS — R82998 Other abnormal findings in urine: Secondary | ICD-10-CM | POA: Diagnosis not present

## 2017-10-30 DIAGNOSIS — Z8041 Family history of malignant neoplasm of ovary: Secondary | ICD-10-CM

## 2017-10-30 DIAGNOSIS — R350 Frequency of micturition: Secondary | ICD-10-CM | POA: Diagnosis not present

## 2017-10-30 DIAGNOSIS — R339 Retention of urine, unspecified: Secondary | ICD-10-CM | POA: Diagnosis not present

## 2017-10-30 MED ORDER — ESTRADIOL 0.1 MG/GM VA CREA: TOPICAL_CREAM | VAGINAL | 0 refills | Status: DC

## 2017-10-30 NOTE — Progress Notes (Signed)
GYNECOLOGY  VISIT  CC:   Ultrasound and urinary symptoms  HPI: 57 y.o. G30P2002 Married Caucasian female here for complaint of issues with dysuria that started in early January.  She felt like she was on the verge of having a UTI.  This continued for a couple of weeks and she was seen 10/07/17.  Was diagnosed with external yeast and treated with mycolog.  Reports in early January, she started having issues with urinary pain like she was going to have a UTI.  She was seen 10/07/17.  Was diagnosed with external yeast.  Was treated with mycolog.  Vaginitis testing was negative.  Feels external tissue is better but continues to have symptoms like she is on the verge of a UTI.  She doesn't have dysuria but does have urgency and some increased frequency.  Denies vaginal discharge or vaginal bleeding.  Urine is not dark and there is no odor.  Ultrasound also performed today due to family hx of ovarian cancer.  Ca-125 on 10/14/17 was normal at 17.5.  GYNECOLOGIC HISTORY: Patient's last menstrual period was 09/09/2016 (exact date). Contraception: PMP Menopausal hormone therapy: none  Patient Active Problem List   Diagnosis Date Noted  . GAD (generalized anxiety disorder) 06/25/2016  . DDD (degenerative disc disease), lumbar 05/28/2016  . Neuropathic pain 04/02/2016  . Arthritis of lumbar spine 02/29/2016  . After-cataract of left eye with vision obscured 12/13/2015  . S/P YAG capsulotomy 07/03/2015  . Status post eye surgery 02/28/2015  . Asthma without status asthmaticus 02/23/2015  . Combined form of senile cataract of left eye 01/18/2015  . Back stiffness 09/20/2014  . Low back pain with right-sided sciatica 09/20/2014  . Uterine leiomyoma 10/08/2013  . Family history of breast cancer 10/08/2013  . Family history of ovarian cancer 10/08/2013  . Functional ovarian cysts 10/08/2013  . Blepharitis 06/29/2013  . 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  Keratitis sicca, both eyes 06/29/2013  . Hormone imbalance 06/29/2013  . MGD (meibomian gland disease) 06/29/2013  . RCE (recurrent corneal erosion) 06/29/2013  . History of vertigo 03/23/2013  . Rosacea 03/07/2013  . Lipoma 04/30/2012  . Abdominal wall hernia 04/10/2012  . Chronic insomnia 04/04/2009  . Allergic rhinitis 09/18/2008    Past Medical History:  Diagnosis Date  . Anxiety   . Asthma   . Back pain   . Frequent UTI    post coital  . 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 Hypothyroidism   . Lumbar herniated disc   . Positional vertigo   . Shingles   . Shingles     Past Surgical History:  Procedure Laterality Date  . corneal dsaek     both eyes  . URETHRAL STRICTURE DILATATION      MEDS:   Current Outpatient Medications on File Prior to Visit  Medication Sig Dispense Refill  . albuterol (PROVENTIL HFA) 108 (90 Base) MCG/ACT inhaler Inhale into the lungs.    . Azelaic Acid (FINACEA) 15 % cream MASSAGE A THIN FILM OF GEL INTO THE AFFECTED AREA TWICE DAILY.    Marland Kitchen buPROPion (WELLBUTRIN XL) 300 MG 24 hr tablet Take by mouth daily.    . fluconazole (DIFLUCAN) 150 MG tablet Take one tablet today and repeat one in 5 days. 2 tablet 0  . gabapentin (NEURONTIN) 100 MG capsule Take 100 mg by mouth 3 (three) times daily.    Marland Kitchen levocetirizine (XYZAL) 5 MG tablet every evening.  5  .  levothyroxine (SYNTHROID, LEVOTHROID) 25 MCG tablet TAKE 1 TABLET BY MOUTH EVERY DAY IN THE MORNING ON EMPTY STOMACH  0  . LORazepam (ATIVAN) 0.5 MG tablet Take 0.5 mg by mouth as needed for anxiety.    . meclizine (ANTIVERT) 25 MG tablet Take 25 mg by mouth 3 (three) times daily as needed for dizziness.     . montelukast (SINGULAIR) 10 MG tablet As needed for colds    . nitrofurantoin, macrocrystal-monohydrate, (MACROBID) 100 MG capsule Take 1 capsule (100 mg total) by mouth 2 (two) times daily. Only for use post coital 30 capsule 3  . promethazine (PHENERGAN) 25 MG tablet Take 25 mg by  mouth as needed for nausea.     . Vitamin D, Cholecalciferol, 1000 units CAPS Take by mouth.     No current facility-administered medications on file prior to visit.     ALLERGIES: Penicillins  Family History  Problem Relation Age of Onset  . Cancer Mother 38       ovarian  . Hypertension Mother   . Hyperlipidemia Mother   . Hyperlipidemia Father   . ALS Father   . Prostate cancer Maternal Grandfather   . Breast cancer Other        MGGM  . Breast cancer Other        Mother's first cousin  . Breast cancer Other        Maternal great aunt    SH:  Married, non smoker  Review of Systems  Constitutional: Negative.   Gastrointestinal: Negative.   Genitourinary: Positive for frequency and urgency. Negative for dysuria and hematuria.    PHYSICAL EXAMINATION:    BP 110/68 (BP Location: Right Arm, Patient Position: Sitting, Cuff Size: Normal)   Pulse 72   Resp 16   Wt 144 lb 6.4 oz (65.5 kg)   LMP 09/09/2016 (Exact Date)   BMI 23.66 kg/m     General appearance: alert, cooperative and appears stated age Abdomen: soft, non-tender; bowel sounds normal; no masses,  no organomegaly  Pelvic: External genitalia:  no lesions              Urethra:  normal appearing urethra with no masses, tenderness or lesions              Bartholins and Skenes: normal                 Vagina: normal appearing vagina with normal color and discharge, no lesions              Cervix: no lesions              Bimanual Exam:  Uterus:  normal size, contour, position, consistency, mobility, non-tender              Adnexa: no mass, fullness, tenderness   Ultrasound: Uterus:  6.7 x 4.0 x 3.7cm with 1.9 x 1.7cm and 0.8 x 0.7cm fibroids noted Endometrium:  3.40mm Left ovary:  2.2 x 1.8 x 0.7cm Right ovary:  2.1 x 2.0 x 1.0cm Cul de sac:  No free fluid  Findings reviewed with pt.  Fibroids have been present on prior ultrasound.  Endometrium thin and ovaries appear normal for PMP female.  Urinary retention  likely noted on ultrasound as bladder still has fair amount present after voiding.  She feels like she is emptying completely at this time.  D/w pt trial of vaginal estrogen and then doing PVR to see if actually improved.  Chaperone was present for exam.  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 Pt will return in 1 month for PVR after using estrogen for several weeks. Plan to repeat PUS 1-2 years.   ~25 minutes spent with patient >50% of time was in face to face discussion of above.

## 2017-10-30 NOTE — Progress Notes (Signed)
     Addendum; Bladder picture taken was a post void bladder  Reviewed with pt personally.

## 2017-11-03 ENCOUNTER — Other Ambulatory Visit: Payer: Self-pay | Admitting: Obstetrics & Gynecology

## 2017-11-03 ENCOUNTER — Encounter: Payer: Self-pay | Admitting: Obstetrics & Gynecology

## 2017-11-03 ENCOUNTER — Ambulatory Visit (INDEPENDENT_AMBULATORY_CARE_PROVIDER_SITE_OTHER): Payer: BC Managed Care – PPO | Admitting: *Deleted

## 2017-11-03 ENCOUNTER — Telehealth: Payer: Self-pay | Admitting: *Deleted

## 2017-11-03 DIAGNOSIS — R3915 Urgency of urination: Secondary | ICD-10-CM

## 2017-11-03 NOTE — Telephone Encounter (Signed)
Spoke with patient, advised as seen below per Dr. Sabra Heck. Patient request to come today, nurse visit scheduled for 2/18 at 1:30pm. Patient verbalizes understanding and is agreeable.    Megan Salon, MD  [facility] Triage Pool  Could you please call this pt. I saw her on Thursday for PUS but also discussed urinary issues she is having. In thinking about her this weekend (and finishing her note), I realized that she's had urine micro done with [name] but not a urine culture. I think she needs to come back and give me a urine sample for urine culture. Please do not charge a co pay for this. Just see if she will come by and leave a urine for culture. Order has been placed. Thanks.   Routing to provider for final review. Patient is agreeable to disposition. Will close encounter.

## 2017-11-03 NOTE — Progress Notes (Signed)
Urine culture order placed.

## 2017-11-03 NOTE — Progress Notes (Signed)
Patient in office for urine culture. CC urine sent for urine culture. No charge for nurse visit per Dr. Sabra Heck.   Routing to provider for final review. Patient agreeable to disposition. Will close encounter.

## 2017-11-05 ENCOUNTER — Telehealth: Payer: Self-pay | Admitting: Obstetrics & Gynecology

## 2017-11-05 NOTE — Telephone Encounter (Signed)
Spoke with patient, requesting urine culture results from 2/18. Advised patient results not back from urine culture, will return call once resulted and reviewed by Dr. Sabra Heck.   Reports symptoms are the same.   Patient states she was advised while in office that results would return in 48 hrs. Advised patient to allow for at least 72 hrs for urine culture, provided explanation. Patient verbalizes understanding is agreeable.   Routing to provider for final review. Patient is agreeable to disposition. Will close encounter.

## 2017-11-05 NOTE — Telephone Encounter (Signed)
Patient called inquiring about her lab results DOS 10/30/17

## 2017-11-06 ENCOUNTER — Telehealth: Payer: Self-pay | Admitting: Obstetrics & Gynecology

## 2017-11-06 MED ORDER — SULFAMETHOXAZOLE-TRIMETHOPRIM 800-160 MG PO TABS: 1.0000 | ORAL_TABLET | Freq: Two times a day (BID) | ORAL | 0 refills | Status: AC

## 2017-11-06 NOTE — Telephone Encounter (Signed)
Can you please let her know the urine culture is positive for bacteria.  The result is not final but I think she should start on Bactrim DX BID x 5 days.  Can send rx to pharmacy.  Will likely need to repeat urine culture once antibiotics are completed so can go ahead and schedule this for 10-14 days after antibiotics are completed.  Thanks.

## 2017-11-06 NOTE — Telephone Encounter (Signed)
Spoke with patient. Advised urine culture results have not resulted, advised this can take at least 72 hrs from the time specimen is received by the lab.   Patient expressed frustration, states she was advised when she was in the office on 11/03/17, "2 test will be completed and they will absolutely be back in 48 hrs". Advised patient per our telephone conversation on 2/18, urine micros have been done with Melvia Heaps, CNM, but no urine culture. Returning for urine culture. Advised only culture has been collected. Explained difference in urine micro and culture.   States this has been an long,  ongoing problem and symptoms have not resolved. Patient asking what next steps are if culture is negative?   Advised patient will review with Dr. Sabra Heck and return call with recommendations.  Dr. Sabra Heck -please review

## 2017-11-06 NOTE — Telephone Encounter (Signed)
Patient called and requested her recent urinalysis results. She'd like to know if they're ready for her yet.

## 2017-11-06 NOTE — Telephone Encounter (Signed)
Spoke with patient, advised as seen below per Dr. Sabra Heck. Rx to verified pharmacy. Nurse visit scheduled for 11/18/17 at 10am for repeat urine culture. Patient verbalizes understanding and is agreeable.   Routing to provider for final review. Patient is agreeable to disposition. Will close encounter.

## 2017-11-07 ENCOUNTER — Telehealth: Payer: Self-pay | Admitting: Certified Nurse Midwife

## 2017-11-07 LAB — URINE CULTURE

## 2017-11-07 NOTE — Telephone Encounter (Signed)
Please let her know it showed Klebsiella.  Appropriate antibiotics were presecribed.  Needs repeat Culture for TOC 7-10 days after antibiotics are completed.

## 2017-11-07 NOTE — Telephone Encounter (Signed)
Patient calling for results of urine culture.

## 2017-11-07 NOTE — Telephone Encounter (Signed)
Spoke with patient. Advised of message as seen below from [place]. Patient verbalizes understanding. Will keep appointment for TOC on 11/18/2017. Encounter closed.

## 2017-11-07 NOTE — Telephone Encounter (Signed)
Patient is calling for final urine culture results. Final results show klebsiella pneumoniae. Patient was started on Bactrim DS 800-160 yesterday. Routing to [place] for final review.

## 2017-11-14 ENCOUNTER — Telehealth: Payer: Self-pay | Admitting: Obstetrics & Gynecology

## 2017-11-14 HISTORY — PX: KNEE ARTHROSCOPY: SUR90

## 2017-11-14 MED ORDER — SULFAMETHOXAZOLE-TRIMETHOPRIM 800-160 MG PO TABS: 1.0000 | ORAL_TABLET | Freq: Two times a day (BID) | ORAL | 0 refills | Status: AC

## 2017-11-14 NOTE — Telephone Encounter (Signed)
Patients UTI symptoms have returned. . [facility], [address] [place], [place]

## 2017-11-14 NOTE — Telephone Encounter (Signed)
Spoke with patient, advised as seen below per Dr. Sabra Heck. Rx sent to requested pharmacy. TOC appt reschedule to 11/28/17 as OV with Dr. Sabra Heck, patient request to f/u with estrogen and has additional concerns to discuss regarding recurrent UTI.   Routing to provider for final review. Patient is agreeable to disposition. Will close encounter.

## 2017-11-14 NOTE — Telephone Encounter (Signed)
Spoke with patient. Completed Bactrim BID x5days on 2/26, "felt 100% better for 24hrs", UTI symptoms returned on 2/27. In [place] for vacation, requesting antibiotic RX.   Reports same symptoms as before, "uncomfortable, not completely emptying bladder", voids small amounts, "irritated at urethra". Used urine test strip last night, "increased WBC". Using vaginal estrogen cream as prescribed. Has TOC nurse visit scheduled for 3/5.   Advised will review with Dr. Sabra Heck and return call.  Dr. Sabra Heck -please advise?

## 2017-11-14 NOTE — Telephone Encounter (Signed)
OK to use Bactrim DS BID x 5 days again.  Ok to call into pharmacy.

## 2017-11-18 ENCOUNTER — Ambulatory Visit: Payer: Self-pay

## 2017-11-28 ENCOUNTER — Ambulatory Visit (INDEPENDENT_AMBULATORY_CARE_PROVIDER_SITE_OTHER): Payer: BC Managed Care – PPO | Admitting: Obstetrics & Gynecology

## 2017-11-28 ENCOUNTER — Other Ambulatory Visit: Payer: Self-pay

## 2017-11-28 ENCOUNTER — Encounter: Payer: Self-pay | Admitting: Obstetrics & Gynecology

## 2017-11-28 VITALS — BP 116/58 | HR 80 | Temp 97.7°F | Resp 16 | Wt 150.0 lb

## 2017-11-28 DIAGNOSIS — N3090 Cystitis, unspecified without hematuria: Secondary | ICD-10-CM | POA: Diagnosis not present

## 2017-11-28 DIAGNOSIS — N390 Urinary tract infection, site not specified: Secondary | ICD-10-CM | POA: Diagnosis not present

## 2017-11-28 NOTE — Progress Notes (Signed)
GYNECOLOGY  VISIT  CC:   Follow-up after UTI  HPI: 57 y.o. G56P2002 Married Caucasian female here for follow up.  Symptoms are all gone.  Denies discharge, urinary pressure, urgency, pelvic pressure.  Feels "fine".    GYNECOLOGIC HISTORY: Patient's last menstrual period was 09/09/2016 (exact date). Contraception: post menopausal  Menopausal hormone therapy: none  Patient Active Problem List   Diagnosis Date Noted  . GAD (generalized anxiety disorder) 06/25/2016  . DDD (degenerative disc disease), lumbar 05/28/2016  . Neuropathic pain 04/02/2016  . Arthritis of lumbar spine 02/29/2016  . After-cataract of left eye with vision obscured 12/13/2015  . S/P YAG capsulotomy 07/03/2015  . Status post eye surgery 02/28/2015  . Asthma without status asthmaticus 02/23/2015  . Combined form of senile cataract of left eye 01/18/2015  . Back stiffness 09/20/2014  . Low back pain with right-sided sciatica 09/20/2014  . Uterine leiomyoma 10/08/2013  . Family history of breast cancer 10/08/2013  . Family history of ovarian cancer 10/08/2013  . Blepharitis 06/29/2013  . 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 Keratitis sicca, both eyes 06/29/2013  . MGD (meibomian gland disease) 06/29/2013  . RCE (recurrent corneal erosion) 06/29/2013  . History of vertigo 03/23/2013  . Rosacea 03/07/2013  . Lipoma 04/30/2012  . Abdominal wall hernia 04/10/2012  . Chronic insomnia 04/04/2009  . Allergic rhinitis 09/18/2008    Past Medical History:  Diagnosis Date  . Anxiety   . Asthma   . Back pain   . Frequent UTI    post coital  . 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 Hypothyroidism   . Lumbar herniated disc   . Positional vertigo   . Shingles   . Shingles     Past Surgical History:  Procedure Laterality Date  . corneal dsaek     both eyes  . URETHRAL STRICTURE DILATATION      MEDS:   Current Outpatient Medications on File Prior to Visit   Medication Sig Dispense Refill  . albuterol (PROVENTIL HFA) 108 (90 Base) MCG/ACT inhaler Inhale into the lungs.    . Azelaic Acid (FINACEA) 15 % cream MASSAGE A THIN FILM OF GEL INTO THE AFFECTED AREA TWICE DAILY.    Marland Kitchen buPROPion (WELLBUTRIN XL) 300 MG 24 hr tablet Take by mouth daily.    Marland Kitchen estradiol (ESTRACE) 0.1 MG/GM vaginal cream 1 gram vaginally two to three times weekly 42.5 g 0  . levocetirizine (XYZAL) 5 MG tablet every evening.  5  . levothyroxine (SYNTHROID, LEVOTHROID) 25 MCG tablet TAKE 1 TABLET BY MOUTH EVERY DAY IN THE MORNING ON EMPTY STOMACH  0  . LORazepam (ATIVAN) 0.5 MG tablet Take 0.5 mg by mouth as needed for anxiety.    . meclizine (ANTIVERT) 25 MG tablet Take 25 mg by mouth 3 (three) times daily as needed for dizziness.     . montelukast (SINGULAIR) 10 MG tablet As needed for colds    . promethazine (PHENERGAN) 25 MG tablet Take 25 mg by mouth as needed for nausea.     . Vitamin D, Cholecalciferol, 1000 units CAPS Take by mouth.     No current facility-administered medications on file prior to visit.     ALLERGIES: Penicillins  Family History  Problem Relation Age of Onset  . Cancer Mother 60       ovarian  . Hypertension Mother   . Hyperlipidemia Mother   . Hyperlipidemia Father   . ALS Father   .  Prostate cancer Maternal Grandfather   . Breast cancer Other        MGGM  . Breast cancer Other        Mother's first cousin  . Breast cancer Other        Maternal great aunt    SH:  Married, non smoker  Review of Systems  All other systems reviewed and are negative.   PHYSICAL EXAMINATION:    BP (!) 116/58 (BP Location: Left Arm, Patient Position: Sitting, Cuff Size: Normal)   Pulse 80   Temp 97.7 F (36.5 C) (Oral)   Resp 16   Wt 150 lb (68 kg)   LMP 09/09/2016 (Exact Date)   BMI 24.58 kg/m     General appearance: alert, cooperative and appears stated age  Pelvic: External genitalia:  no lesions              Urethra:  normal appearing  urethra with no masses, tenderness or lesions              Bartholins and Skenes: normal                 Vagina: normal appearing vagina with normal color and discharge, no lesions               Chaperone was present for exam.  Assessment: Cystitis, much improved  Plan: Repeat urine culture pending

## 2017-11-29 LAB — URINE CULTURE: Organism ID, Bacteria: NO GROWTH

## 2017-12-05 ENCOUNTER — Ambulatory Visit: Payer: BC Managed Care – PPO | Admitting: Obstetrics & Gynecology

## 2017-12-22 ENCOUNTER — Other Ambulatory Visit: Payer: Self-pay | Admitting: Certified Nurse Midwife

## 2017-12-22 NOTE — Telephone Encounter (Signed)
Medication refill request: Macrobid after intercourse  Last AEX:  10-14-17  Next AEX: 10-21-18  Last MMG (if hormonal medication request): 09-29-17 WNL  Refill authorized: please advise

## 2018-08-03 ENCOUNTER — Encounter: Payer: Self-pay | Admitting: Obstetrics & Gynecology

## 2018-08-03 ENCOUNTER — Other Ambulatory Visit (HOSPITAL_COMMUNITY)
Admission: RE | Admit: 2018-08-03 | Discharge: 2018-08-03 | Disposition: A | Discharge status: Home / Self Care | Payer: BC Managed Care – PPO | Source: Ambulatory Visit | Attending: Obstetrics & Gynecology | Admitting: Obstetrics & Gynecology

## 2018-08-03 ENCOUNTER — Other Ambulatory Visit: Payer: Self-pay

## 2018-08-03 ENCOUNTER — Ambulatory Visit: Payer: BC Managed Care – PPO | Admitting: Obstetrics & Gynecology

## 2018-08-03 ENCOUNTER — Telehealth: Payer: Self-pay | Admitting: Certified Nurse Midwife

## 2018-08-03 VITALS — BP 102/60 | HR 88 | Resp 16 | Ht 65.5 in | Wt 159.2 lb

## 2018-08-03 DIAGNOSIS — Z124 Encounter for screening for malignant neoplasm of cervix: Secondary | ICD-10-CM | POA: Insufficient documentation

## 2018-08-03 DIAGNOSIS — N950 Postmenopausal bleeding: Secondary | ICD-10-CM

## 2018-08-03 LAB — HEMOGLOBIN: HEMOGLOBIN: 14.8

## 2018-08-03 NOTE — Progress Notes (Signed)
GYNECOLOGY  VISIT  CC:   Vaginal bleeding  HPI: 57 y.o. G75P2002 Married Caucasian female here for postmenopausal bleeding x 3 days.  Before the bleeding began, she did have a little left sided breast tenderness.  Since entering menopause, feels she's just had more vaginal sensitivity.  Did have a UTI earlier this year and symptoms were much worse then.  This has improved but not completely resolved.  Denies vaginal discharge.  Denies pelvic pain, back pain or fever.  GYNECOLOGIC HISTORY: Patient's last menstrual period was 09/09/2016 (exact date). Contraception: PMP Menopausal hormone therapy: none  Patient Active Problem List   Diagnosis Date Noted  . GAD (generalized anxiety disorder) 06/25/2016  . DDD (degenerative disc disease), lumbar 05/28/2016  . Neuropathic pain 04/02/2016  . Arthritis of lumbar spine 02/29/2016  . After-cataract of left eye with vision obscured 12/13/2015  . S/P YAG capsulotomy 07/03/2015  . Status post eye surgery 02/28/2015  . Asthma without status asthmaticus 02/23/2015  . Combined form of senile cataract of left eye 01/18/2015  . Back stiffness 09/20/2014  . Low back pain with right-sided sciatica 09/20/2014  . Uterine leiomyoma 10/08/2013  . Family history of breast cancer 10/08/2013  . Family history of ovarian cancer 10/08/2013  . Blepharitis 06/29/2013  . 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 Keratitis sicca, both eyes 06/29/2013  . MGD (meibomian gland disease) 06/29/2013  . RCE (recurrent corneal erosion) 06/29/2013  . History of vertigo 03/23/2013  . Rosacea 03/07/2013  . Lipoma 04/30/2012  . Abdominal wall hernia 04/10/2012  . Chronic insomnia 04/04/2009  . Allergic rhinitis 09/18/2008    Past Medical History:  Diagnosis Date  . Anxiety   . Asthma   . Back pain   . Frequent UTI    post coital  . 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 Hypothyroidism   . Lumbar herniated disc   .  Positional vertigo   . Shingles   . Shingles     Past Surgical History:  Procedure Laterality Date  . corneal dsaek     both eyes  . URETHRAL STRICTURE DILATATION      MEDS:   Current Outpatient Medications on File Prior to Visit  Medication Sig Dispense Refill  . albuterol (PROVENTIL HFA) 108 (90 Base) MCG/ACT inhaler Inhale into the lungs.    . Azelaic Acid (FINACEA) 15 % cream MASSAGE A THIN FILM OF GEL INTO THE AFFECTED AREA TWICE DAILY.    Marland Kitchen buPROPion (WELLBUTRIN XL) 300 MG 24 hr tablet Take by mouth daily.    Marland Kitchen levothyroxine (SYNTHROID, LEVOTHROID) 25 MCG tablet TAKE 1 TABLET BY MOUTH EVERY DAY IN THE MORNING ON EMPTY STOMACH  0  . LORazepam (ATIVAN) 0.5 MG tablet Take 0.5 mg by mouth as needed for anxiety.    . meclizine (ANTIVERT) 25 MG tablet Take 25 mg by mouth 3 (three) times daily as needed for dizziness.     . montelukast (SINGULAIR) 10 MG tablet As needed for colds    . nitrofurantoin, macrocrystal-monohydrate, (MACROBID) 100 MG capsule Take one post coital and repeat one in 12 hours if needed and symptoms present 30 capsule 6  . promethazine (PHENERGAN) 25 MG tablet Take 25 mg by mouth as needed for nausea.     . Vitamin D, Cholecalciferol, 1000 units CAPS Take by mouth.     No current facility-administered medications on file prior to visit.     ALLERGIES: Penicillins  Family History  Problem  Relation Age of Onset  . Cancer Mother 13       ovarian  . Hypertension Mother   . Hyperlipidemia Mother   . Hyperlipidemia Father   . ALS Father   . Prostate cancer Maternal Grandfather   . Breast cancer Other        MGGM  . Breast cancer Other        Mother's first cousin  . Breast cancer Other        Maternal great aunt    SH:  Married, non smoker  Review of Systems  All other systems reviewed and are negative.   PHYSICAL EXAMINATION:    BP 102/60 (BP Location: Right Arm, Patient Position: Sitting, Cuff Size: Normal)   Pulse 88   Resp 16   Ht 5' 5.5"  (1.664 m)   Wt 159 lb 3.2 oz (72.2 kg)   LMP 09/09/2016 (Exact Date)   BMI 26.09 kg/m     General appearance: alert, cooperative and appears stated age Abdomen: soft, non-tender; bowel sounds normal; no masses,  no organomegaly Lymph:  no inguinal LAD noted  Pelvic: External genitalia:  no lesions              Urethra:  normal appearing urethra with no masses, tenderness or lesions              Bartholins and Skenes: normal                 Vagina: normal appearing vagina with normal color and discharge, no lesions              Cervix: no lesions              Bimanual Exam:  Uterus:  normal size, contour, position, consistency, mobility, non-tender              Adnexa: no mass, fullness, tenderness  Endometrial biopsy recommended.  Discussed with patient.  Verbal and written consent obtained.   Procedure:  Speculum placed.  Cervix visualized and cleansed with betadine prep.  A single toothed tenaculum was applied to the anterior lip of the cervix.  Endometrial pipelle was advanced through the cervix into the endometrial cavity without difficulty.  Pipelle passed to 8cm.  Suction applied and pipelle removed with good tissue sample obtained.  Tenculum removed.  Bleeding noted at tenaculum site that was made hemostatic with silver nitrate.  Patient tolerated procedure well.  Chaperone was present for exam.  Assessment: PMP bleeding Vaginal irritation/sensitivity   Plan: [place] and estradiol obtained today Endometrial biopsy obtained today Affirm pending Pap also obtained Additional recommendations will be made after all results are finalized.

## 2018-08-03 NOTE — Telephone Encounter (Signed)
Message left to return call to Triage Nurse at [phone].    

## 2018-08-03 NOTE — Telephone Encounter (Signed)
Patient returning call to Emily.

## 2018-08-03 NOTE — Telephone Encounter (Signed)
Call to patient. Patient states she started having bleeding on Friday afternoon. States the bleeding is "a little more than spotting." Denies pain or fever. RN advised OV recommended for further evaluation. RN reviewed with nursing supervisor and patient scheduled for 08-03-18 at 1615. Patient agreeable to date and time of appointment.   Routing to provider and will close encounter.

## 2018-08-03 NOTE — Telephone Encounter (Signed)
Patient called stating she has started bleeding over the weekend. Patient said she was told to call if this happened.

## 2018-08-04 LAB — FOLLICLE STIMULATING HORMONE: FSH: 112.6 m[IU]/mL

## 2018-08-04 LAB — ESTRADIOL

## 2018-08-05 LAB — CYTOLOGY - PAP: Diagnosis: NEGATIVE

## 2018-08-05 LAB — VAGINITIS/VAGINOSIS, DNA PROBE
Candida Species: NEGATIVE
GARDNERELLA VAGINALIS: NEGATIVE
TRICHOMONAS VAG: NEGATIVE

## 2018-08-06 ENCOUNTER — Telehealth: Payer: Self-pay | Admitting: Obstetrics & Gynecology

## 2018-08-06 DIAGNOSIS — N950 Postmenopausal bleeding: Secondary | ICD-10-CM

## 2018-08-06 NOTE — Telephone Encounter (Signed)
Patient returning Jill's call.  °

## 2018-08-06 NOTE — Telephone Encounter (Signed)
Spoke with patient, advised as seen below per Dr. Sabra Heck. Patient declined PUS for 12/5, patient requesting earlier appointment. Advised I will need to review schedule with Dr. Sabra Heck and return call, patient agreeable.   Order placed for PUS for precert.

## 2018-08-06 NOTE — Telephone Encounter (Signed)
Notes recorded by Burnice Logan, RN on 08/06/2018 at 11:46 AM EST Left message to call Sharee Pimple, RN at [place].

## 2018-08-06 NOTE — Telephone Encounter (Signed)
Schedule reviewed with Kandace Blitz, RN.   Call returned to patient. PUS scheduled for 08/11/18 at 3:30pm, consult to follow with Dr. Sabra Heck. Patient aware she will be worked into Dr. Sanjuan Dame schedule for consult, patient agreeable.   Routing to provider for final review. Patient is agreeable to disposition. Will close encounter.  Cc: Lerry Liner

## 2018-08-06 NOTE — Telephone Encounter (Signed)
-----   Message from Megan Salon, MD sent at 08/05/2018  8:46 PM EST ----- Please let pt know her pap was normal and endometrial biopsy only showed atrophic tissue without any cellular abnormality.  FSH was elevated at 112 and estradiol level was low.  Vaginitis testing was negative.  This all looks good but doesn't explain the bleeding so I would recommend a PUS to evaluate for endometrial polyp as well.  Thanks.  CC:  Lowell Bouton

## 2018-08-06 NOTE — Telephone Encounter (Signed)
Patient called to see if her blood test results are back from [facility] 08/03/18. She also had a biopsy that day.

## 2018-08-11 ENCOUNTER — Ambulatory Visit (INDEPENDENT_AMBULATORY_CARE_PROVIDER_SITE_OTHER): Payer: BC Managed Care – PPO

## 2018-08-11 ENCOUNTER — Ambulatory Visit: Payer: BC Managed Care – PPO | Admitting: Obstetrics & Gynecology

## 2018-08-11 ENCOUNTER — Other Ambulatory Visit: Payer: Self-pay | Admitting: Obstetrics & Gynecology

## 2018-08-11 VITALS — BP 108/72 | HR 84 | Resp 16 | Ht 65.5 in | Wt 159.6 lb

## 2018-08-11 DIAGNOSIS — N950 Postmenopausal bleeding: Secondary | ICD-10-CM

## 2018-08-11 DIAGNOSIS — D251 Intramural leiomyoma of uterus: Secondary | ICD-10-CM

## 2018-08-11 NOTE — Progress Notes (Signed)
57 y.o. G2P2 Married Caucasian female here for a pelvic ultrasound with sonohystogram due to PMP bleeding.  She's already had an endometrial biopsy that was negative.  FSH was elevated and estradiol was <5.  Biopsy showed atrophic endometrium.  She's already had a normal Pap smear.  Because I do not have a cause, yet, for her bleeding, [facility] recommended.  She is here for this today.  Patient's last menstrual period was 09/09/2016 (exact date).  Contraception:  PMP  Technique:  Both transabdominal and transvaginal ultrasound examinations of the pelvis were performed. Transabdominal technique was performed for global imaging of the pelvis including uterus, ovaries, adnexal regions, and pelvic cul-de-sac.  It was necessary to proceed with endovaginal exam following the abdominal ultrasound transabdominal exam to visualize the endometrium and adnexa.  Color and duplex Doppler ultrasound was utilized to evaluate blood flow to the ovaries.   FINDINGS: Uterus: 7.8 x 5.3 x 4.0cm with 1.9 x 1.0cm fibroid and 1.3 x 1.0cm fibroid (stable since prior exam) Endometrium: 5.22mm (borderline thickness of endometrium) Adnexa:  Left: 1.9 x 1.6 x 0.8cm     Right: 2.1 x 1.6 x 0.8cm with 4 x 34mm follicle Cul de sac: no free fluid  SHSG:  After obtaining appropriate verbal consent from patient, the cervix was visualized using a speculum, and prepped with betadine.  A tenaculum  was not applied to the cervix.  Dilation of the cervix was not necessary. The catheter was passed into the uterus and sterile saline introduced, with the following findings: no intracavitary lesions.  Remeasurement of endometrium was near 9mm  Assessment: PMP bleeding with no abnormal findings with evaluation Known fibroids  Plan:   No additional recommendations made at this time.  If pt does have future bleeding, advised to call back.  Have recommended proceeding with hysteroscopy at that time and she is comfortable with this plan.  ~20  minutes spent with patient >50% of time was in face to face discussion of above.

## 2018-08-12 ENCOUNTER — Encounter: Payer: Self-pay | Admitting: Obstetrics & Gynecology

## 2018-08-18 ENCOUNTER — Other Ambulatory Visit: Payer: Self-pay | Admitting: Certified Nurse Midwife

## 2018-08-18 DIAGNOSIS — Z1231 Encounter for screening mammogram for malignant neoplasm of breast: Secondary | ICD-10-CM

## 2018-09-14 ENCOUNTER — Other Ambulatory Visit: Payer: Self-pay | Admitting: Obstetrics & Gynecology

## 2018-09-14 ENCOUNTER — Encounter (HOSPITAL_BASED_OUTPATIENT_CLINIC_OR_DEPARTMENT_OTHER): Payer: Self-pay

## 2018-09-14 ENCOUNTER — Telehealth: Payer: Self-pay | Admitting: Obstetrics & Gynecology

## 2018-09-14 ENCOUNTER — Other Ambulatory Visit: Payer: Self-pay

## 2018-09-14 NOTE — Telephone Encounter (Signed)
Call to patient. Per ROI can leave message on voice mail.  Left message calling to review scheduling options. Call back at her convenience.

## 2018-09-14 NOTE — Telephone Encounter (Signed)
Call to patient. Surgery date options reviewed. Patient desires to proceed on 09-28-18. Surgery instruction sheet reviewed and will provide printed copy on 09-18-18 at consult with Dr Sabra Heck.   Encounter closed.

## 2018-09-14 NOTE — Telephone Encounter (Signed)
Patient returned call

## 2018-09-14 NOTE — Progress Notes (Signed)
Spoke with:  Saima NPO:  After Midnight, no gum, candy, or mints   Arrival time:  [id number] Labs: CBC AM medications: Levothyroxine, Lorazepam if needed, Bring Asthma Inhaler Pre op orders: Yes Ride home:  Lynne Leader (daughter) [phone]

## 2018-09-14 NOTE — Telephone Encounter (Signed)
Agree with proceeding to schedule hysteroscopy with D&C.  Thanks.

## 2018-09-14 NOTE — Telephone Encounter (Signed)
Spoke with patient. Patient states that she began having light spotting on 09/11/2018 that lasted through 09/12/2018. Denies any heavy bleeding. Patient was last seen for [facility] on 08/11/2018 and discussed having a hysteroscopy with Dr.Miller if this occurred again. Would like to proceed. Advised will notify Dr.Miller and Lamont Snowball, RN for planning. Patient is agreeable and is aware she will receive a call back to discuss scheduling.

## 2018-09-14 NOTE — Telephone Encounter (Signed)
Patient called and stated that she was seen "about a month ago" for PMB. Patient stated that she was told to let Dr. Sabra Heck know if it happened again because she would want to schedule a D&C. Patient stated that she experienced PMB again this weekend.

## 2018-09-15 NOTE — Progress Notes (Signed)
GYNECOLOGY  VISIT  CC:   PMP bleeding  HPI: 57 y.o. G77P2002 Married [place] female here for complaint of recurrent PMP bleeding.  She states that she experienced bleeding last Friday and Saturday.  Patient says she was having uti symptoms but started to take Macrobid and is feeling much better.  She had an Freeborn obtained 08/03/18 of 112 and a pap smear and endometrial biopsy at the same time which were normal.  Ultrasound was also performed 08/11/18 with uterus measuring 7.8 x 5.3 x 4.0cm with endometrial measing 5.73mm.  There were two intramural fibroids measuring 1.9 x 1.0xm and 1.3 x 1.0cm  Ovaries were normal.    Due to recurrent PMP bleeding, hysteroscopy with D&C is recommended.  When she called with bleeding, this was advised and as she is leaving for [place] tomorrow, she wanted to proceed with scheduling but we have not discussed this together and we need to review procedure, risks, benefits, and alternative.  Procedure discussed with patient.  Recovery and pain management discussed.  Risks discussed including but not limited to bleeding, rare risk of transfusion, infection, 1% risk of uterine perforation with risks of fluid deficit causing cardiac arrythmia, cerebral swelling and/or need to stop procedure early.  Fluid emboli and rare risk of death discussed.  DVT/PE, rare risk of risk of bowel/bladder/ureteral/vascular injury.  Patient aware if pathology abnormal she may need additional treatment.  Treatment with progestin therapy is an option as well.  She is not interested in this currently.      Patient reports her mother had this same procedure done around the same time.  She just thought this was interesting and wanted to share this.    GYNECOLOGIC HISTORY: Patient's last menstrual period was 09/09/2016 (exact date). Contraception: post menopausal  Menopausal hormone therapy: none  Patient Active Problem List   Diagnosis Date Noted  . GAD (generalized anxiety disorder) 06/25/2016   . DDD (degenerative disc disease), lumbar 05/28/2016  . Neuropathic pain 04/02/2016  . Arthritis of lumbar spine 02/29/2016  . After-cataract of left eye with vision obscured 12/13/2015  . S/P YAG capsulotomy 07/03/2015  . Status post eye surgery 02/28/2015  . Asthma without status asthmaticus 02/23/2015  . Combined form of senile cataract of left eye 01/18/2015  . Back stiffness 09/20/2014  . Low back pain with right-sided sciatica 09/20/2014  . Uterine leiomyoma 10/08/2013  . Family history of breast cancer 10/08/2013  . Family history of ovarian cancer 10/08/2013  . Blepharitis 06/29/2013  . 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 Keratitis sicca, both eyes 06/29/2013  . MGD (meibomian gland disease) 06/29/2013  . RCE (recurrent corneal erosion) 06/29/2013  . History of vertigo 03/23/2013  . Rosacea 03/07/2013  . Lipoma 04/30/2012  . Abdominal wall hernia 04/10/2012  . Chronic insomnia 04/04/2009  . Allergic rhinitis 09/18/2008    Past Medical History:  Diagnosis Date  . Anemia   . Arthritis   . Asthma   . Blepharitis   . Cataract    Bilateral  . Cherry angioma    trunk and extremities  . DDD (degenerative disc disease), lumbar   . Eczema   . Fibroids   . Frequent UTI    post coital  . 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 GAD (generalized anxiety disorder)   . Hypothyroidism   . Lipoma 04/30/2012   Rib cage and knee  . Lumbar herniated disc   . Meibomian gland dysfunction (MGD)   .  Plantar wart of right foot   . Positional vertigo   . Rosacea   . Shingles     Past Surgical History:  Procedure Laterality Date  . CAPSULOTOMY Right    EYE, Posterior  . CATARACT EXTRACTION, BILATERAL    . corneal tissue transplants     both eyes  . Epidural Steroid Injections    . KNEE ARTHROSCOPY Left 11/2017   Open cyst excision  . URETHRAL STRICTURE DILATATION     age 48  . WISDOM TOOTH EXTRACTION      MEDS:   Current  Outpatient Medications on File Prior to Visit  Medication Sig Dispense Refill  . albuterol (PROVENTIL HFA) 108 (90 Base) MCG/ACT inhaler Inhale into the lungs as needed.     . Azelaic Acid (FINACEA) 15 % cream MASSAGE A THIN FILM OF GEL INTO THE AFFECTED AREA TWICE DAILY.    Marland Kitchen buPROPion (WELLBUTRIN XL) 300 MG 24 hr tablet Take by mouth every morning.     . fexofenadine-pseudoephedrine (ALLEGRA-D) 60-120 MG 12 hr tablet Take 1 tablet by mouth 2 (two) times daily.    . Lactobacillus (PROBIOTIC ACIDOPHILUS PO) Take by mouth every evening.    Marland Kitchen levothyroxine (SYNTHROID, LEVOTHROID) 25 MCG tablet TAKE 1 TABLET BY MOUTH EVERY DAY IN THE MORNING ON EMPTY STOMACH  0  . meclizine (ANTIVERT) 25 MG tablet Take 25 mg by mouth 3 (three) times daily as needed for dizziness.     . montelukast (SINGULAIR) 10 MG tablet As needed for colds    . nitrofurantoin, macrocrystal-monohydrate, (MACROBID) 100 MG capsule Take one post coital and repeat one in 12 hours if needed and symptoms present 30 capsule 6  . promethazine (PHENERGAN) 25 MG tablet Take 25 mg by mouth as needed for nausea.     . Vitamin D, Cholecalciferol, 1000 units CAPS Take by mouth every evening.      No current facility-administered medications on file prior to visit.     ALLERGIES: Penicillins  Family History  Problem Relation Age of Onset  . Cancer Mother 20       ovarian  . Hypertension Mother   . Hyperlipidemia Mother   . Hyperlipidemia Father   . ALS Father   . Prostate cancer Maternal Grandfather   . Breast cancer Other        MGGM  . Breast cancer Other        Mother's first cousin  . Breast cancer Other        Maternal great aunt    SH:  Married,  Review of Systems  Genitourinary: Positive for urgency.       Unscheduled bleeding Excess bleeding     PHYSICAL EXAMINATION:    BP 108/70   Pulse 64   Resp 14   Ht 5\' 5"  (1.651 m)   Wt 160 lb (72.6 kg)   LMP 09/09/2016 (Exact Date)   BMI 26.63 kg/m     Physical  Exam  Constitutional: She is oriented to person, place, and time. She appears well-developed and well-nourished.  Neck: Normal range of motion. No thyromegaly present.  Cardiovascular: Normal rate and regular rhythm.  Respiratory: Effort normal and breath sounds normal.  Neurological: She is alert and oriented to person, place, and time.  Skin: Skin is warm and dry.  Psychiatric: She has a normal mood and affect.   Assessment: Recurrent PMP bleeding with [facility] 112 11/19 Two intramural fibroids Upcoming trip to [place] Possible current UTI  Plan: RF for Ativan  0.5mg  prn anxiety/flying.  #30/0RF Has macrobid rx and is going to take BID x 5 days Pre op CBC obtained today Hysteroscopy with D&C planned

## 2018-09-18 ENCOUNTER — Ambulatory Visit (INDEPENDENT_AMBULATORY_CARE_PROVIDER_SITE_OTHER): Payer: BC Managed Care – PPO | Admitting: Obstetrics & Gynecology

## 2018-09-18 VITALS — BP 108/70 | HR 64 | Resp 14 | Ht 65.0 in | Wt 160.0 lb

## 2018-09-18 DIAGNOSIS — Z01818 Encounter for other preprocedural examination: Secondary | ICD-10-CM

## 2018-09-18 DIAGNOSIS — N950 Postmenopausal bleeding: Secondary | ICD-10-CM

## 2018-09-18 MED ORDER — LORAZEPAM 0.5 MG PO TABS: 0.5000 mg | ORAL_TABLET | ORAL | 0 refills | Status: DC | PRN

## 2018-09-19 LAB — CBC
Hematocrit: 42.5 % (ref 34.0–46.6)
Hemoglobin: 14.5 g/dL (ref 11.1–15.9)
MCH: 31.1 pg (ref 26.6–33.0)
MCHC: 34.1 g/dL (ref 31.5–35.7)
MCV: 91 fL (ref 79–97)
Platelets: 256 10*3/uL (ref 150–450)
RBC: 4.66 x10E6/uL (ref 3.77–5.28)
RDW: 12.4 % (ref 12.3–15.4)
WBC: 4.9 10*3/uL (ref 3.4–10.8)

## 2018-09-21 ENCOUNTER — Encounter: Payer: Self-pay | Admitting: Obstetrics & Gynecology

## 2018-09-28 ENCOUNTER — Other Ambulatory Visit: Payer: Self-pay | Admitting: Obstetrics & Gynecology

## 2018-09-28 ENCOUNTER — Encounter (HOSPITAL_BASED_OUTPATIENT_CLINIC_OR_DEPARTMENT_OTHER)
Admission: RE | Disposition: A | Discharge status: Home / Self Care | Payer: Self-pay | Source: Home / Self Care | Attending: Obstetrics & Gynecology

## 2018-09-28 ENCOUNTER — Ambulatory Visit (HOSPITAL_BASED_OUTPATIENT_CLINIC_OR_DEPARTMENT_OTHER)
Admission: RE | Admit: 2018-09-28 | Discharge: 2018-09-28 | Disposition: A | Discharge status: Home / Self Care | Payer: BC Managed Care – PPO | Attending: Obstetrics & Gynecology | Admitting: Obstetrics & Gynecology

## 2018-09-28 ENCOUNTER — Ambulatory Visit (HOSPITAL_BASED_OUTPATIENT_CLINIC_OR_DEPARTMENT_OTHER): Payer: BC Managed Care – PPO | Admitting: Anesthesiology

## 2018-09-28 ENCOUNTER — Encounter (HOSPITAL_BASED_OUTPATIENT_CLINIC_OR_DEPARTMENT_OTHER): Payer: Self-pay | Admitting: Anesthesiology

## 2018-09-28 DIAGNOSIS — Z79899 Other long term (current) drug therapy: Secondary | ICD-10-CM | POA: Insufficient documentation

## 2018-09-28 DIAGNOSIS — Z803 Family history of malignant neoplasm of breast: Secondary | ICD-10-CM | POA: Insufficient documentation

## 2018-09-28 DIAGNOSIS — N950 Postmenopausal bleeding: Secondary | ICD-10-CM

## 2018-09-28 DIAGNOSIS — E039 Hypothyroidism, unspecified: Secondary | ICD-10-CM | POA: Diagnosis not present

## 2018-09-28 DIAGNOSIS — J45909 Unspecified asthma, uncomplicated: Secondary | ICD-10-CM | POA: Insufficient documentation

## 2018-09-28 DIAGNOSIS — M5136 Other intervertebral disc degeneration, lumbar region: Secondary | ICD-10-CM | POA: Insufficient documentation

## 2018-09-28 DIAGNOSIS — N3592 Unspecified urethral stricture, female: Secondary | ICD-10-CM | POA: Diagnosis not present

## 2018-09-28 DIAGNOSIS — H02889 Meibomian gland dysfunction of unspecified eye, unspecified eyelid: Secondary | ICD-10-CM | POA: Insufficient documentation

## 2018-09-28 DIAGNOSIS — N840 Polyp of corpus uteri: Secondary | ICD-10-CM | POA: Diagnosis not present

## 2018-09-28 HISTORY — DX: Dermatitis, unspecified: L30.9

## 2018-09-28 HISTORY — DX: Hemangioma of skin and subcutaneous tissue: D18.01

## 2018-09-28 HISTORY — DX: Unspecified cataract: H26.9

## 2018-09-28 HISTORY — DX: Other intervertebral disc degeneration, lumbar region: M51.36

## 2018-09-28 HISTORY — DX: Rosacea, unspecified: L71.9

## 2018-09-28 HISTORY — DX: Generalized anxiety disorder: F41.1

## 2018-09-28 HISTORY — PX: DILATATION & CURETTAGE/HYSTEROSCOPY WITH MYOSURE: SHX6511

## 2018-09-28 HISTORY — DX: Benign lipomatous neoplasm, unspecified: D17.9

## 2018-09-28 HISTORY — DX: Unspecified osteoarthritis, unspecified site: M19.90

## 2018-09-28 HISTORY — DX: Other intervertebral disc degeneration, lumbar region without mention of lumbar back pain or lower extremity pain: M51.369

## 2018-09-28 HISTORY — DX: Unspecified blepharitis unspecified eye, unspecified eyelid: H01.009

## 2018-09-28 HISTORY — DX: Benign neoplasm of connective and other soft tissue, unspecified: D21.9

## 2018-09-28 HISTORY — DX: Meibomian gland dysfunction of unspecified eye, unspecified eyelid: H02.889

## 2018-09-28 HISTORY — DX: Plantar wart: B07.0

## 2018-09-28 HISTORY — DX: Anemia, unspecified: D64.9

## 2018-09-28 HISTORY — DX: Nevus, non-neoplastic: I78.1

## 2018-09-28 LAB — CBC
HCT: 42.6 % (ref 36.0–46.0)
Hemoglobin: 14.4 g/dL (ref 12.0–15.0)
MCH: 30.8 pg (ref 26.0–34.0)
MCHC: 33.8 g/dL (ref 30.0–36.0)
MCV: 91 fL (ref 80.0–100.0)
NRBC: 0 % (ref 0.0–0.2)
Platelets: 196 10*3/uL (ref 150–400)
RBC: 4.68 MIL/uL (ref 3.87–5.11)
RDW: 12 % (ref 11.5–15.5)
WBC: 6 10*3/uL (ref 4.0–10.5)

## 2018-09-28 SURGERY — DILATATION & CURETTAGE/HYSTEROSCOPY WITH MYOSURE
Anesthesia: General | Site: Vagina

## 2018-09-28 MED ORDER — PROPOFOL 10 MG/ML IV BOLUS
INTRAVENOUS | Status: AC
  Filled 2018-09-28: qty 40

## 2018-09-28 MED ORDER — DEXAMETHASONE SODIUM PHOSPHATE 4 MG/ML IJ SOLN
INTRAMUSCULAR | Status: DC | PRN
  Administered 2018-09-28: 10 mg via INTRAVENOUS

## 2018-09-28 MED ORDER — SODIUM CHLORIDE 0.9 % IR SOLN
Status: DC | PRN
  Administered 2018-09-28: 3000 mL

## 2018-09-28 MED ORDER — MIDAZOLAM HCL 2 MG/2ML IJ SOLN
INTRAMUSCULAR | Status: AC
  Filled 2018-09-28: qty 2

## 2018-09-28 MED ORDER — LACTATED RINGERS IV SOLN
INTRAVENOUS | Status: DC
  Administered 2018-09-28 (×2): via INTRAVENOUS
  Filled 2018-09-28: qty 1000

## 2018-09-28 MED ORDER — FENTANYL CITRATE (PF) 100 MCG/2ML IJ SOLN
INTRAMUSCULAR | Status: DC | PRN
  Administered 2018-09-28 (×2): 25 ug via INTRAVENOUS
  Administered 2018-09-28: 50 ug via INTRAVENOUS

## 2018-09-28 MED ORDER — ACETAMINOPHEN 160 MG/5ML PO SOLN
325.0000 mg | ORAL | Status: DC | PRN
  Filled 2018-09-28: qty 20.3

## 2018-09-28 MED ORDER — MIDAZOLAM HCL 2 MG/2ML IJ SOLN
1.0000 mg | Freq: Once | INTRAMUSCULAR | Status: AC
  Administered 2018-09-28: 1 mg via INTRAVENOUS
  Filled 2018-09-28: qty 1

## 2018-09-28 MED ORDER — ONDANSETRON HCL 4 MG/2ML IJ SOLN
4.0000 mg | Freq: Once | INTRAMUSCULAR | Status: DC | PRN
  Filled 2018-09-28: qty 2

## 2018-09-28 MED ORDER — LIDOCAINE HCL (CARDIAC) PF 100 MG/5ML IV SOSY
PREFILLED_SYRINGE | INTRAVENOUS | Status: DC | PRN
  Administered 2018-09-28: 100 mg via INTRAVENOUS

## 2018-09-28 MED ORDER — ACETAMINOPHEN 325 MG PO TABS
325.0000 mg | ORAL_TABLET | ORAL | Status: DC | PRN
  Filled 2018-09-28: qty 2

## 2018-09-28 MED ORDER — LIDOCAINE 2% (20 MG/ML) 5 ML SYRINGE
INTRAMUSCULAR | Status: AC
  Filled 2018-09-28: qty 5

## 2018-09-28 MED ORDER — FENTANYL CITRATE (PF) 100 MCG/2ML IJ SOLN
25.0000 ug | INTRAMUSCULAR | Status: DC | PRN
  Filled 2018-09-28: qty 1

## 2018-09-28 MED ORDER — IBUPROFEN 800 MG PO TABS: 800.0000 mg | ORAL_TABLET | Freq: Three times a day (TID) | ORAL | 0 refills | Status: DC | PRN

## 2018-09-28 MED ORDER — KETOROLAC TROMETHAMINE 30 MG/ML IJ SOLN
INTRAMUSCULAR | Status: DC | PRN
  Administered 2018-09-28: 30 mg via INTRAVENOUS

## 2018-09-28 MED ORDER — ONDANSETRON HCL 4 MG/2ML IJ SOLN
INTRAMUSCULAR | Status: AC
  Filled 2018-09-28: qty 2

## 2018-09-28 MED ORDER — PROPOFOL 10 MG/ML IV BOLUS
INTRAVENOUS | Status: DC | PRN
  Administered 2018-09-28: 130 mg via INTRAVENOUS

## 2018-09-28 MED ORDER — MEPERIDINE HCL 25 MG/ML IJ SOLN
6.2500 mg | INTRAMUSCULAR | Status: DC | PRN
  Filled 2018-09-28: qty 1

## 2018-09-28 MED ORDER — MIDAZOLAM HCL 5 MG/5ML IJ SOLN
INTRAMUSCULAR | Status: DC | PRN
  Administered 2018-09-28 (×2): 1 mg via INTRAVENOUS
  Administered 2018-09-28: 2 mg via INTRAVENOUS

## 2018-09-28 MED ORDER — DEXAMETHASONE SODIUM PHOSPHATE 10 MG/ML IJ SOLN
INTRAMUSCULAR | Status: AC
  Filled 2018-09-28: qty 1

## 2018-09-28 MED ORDER — HYDROCODONE-ACETAMINOPHEN 5-325 MG PO TABS: 1.0000 | ORAL_TABLET | Freq: Four times a day (QID) | ORAL | 0 refills | Status: DC | PRN

## 2018-09-28 MED ORDER — FENTANYL CITRATE (PF) 100 MCG/2ML IJ SOLN
INTRAMUSCULAR | Status: AC
  Filled 2018-09-28: qty 2

## 2018-09-28 MED ORDER — OXYCODONE HCL 5 MG PO TABS
5.0000 mg | ORAL_TABLET | Freq: Once | ORAL | Status: DC | PRN
  Filled 2018-09-28: qty 1

## 2018-09-28 MED ORDER — LIDOCAINE-EPINEPHRINE 1 %-1:100000 IJ SOLN
INTRAMUSCULAR | Status: DC | PRN
  Administered 2018-09-28: 10 mL

## 2018-09-28 MED ORDER — SILVER NITRATE-POT NITRATE 75-25 % EX MISC
CUTANEOUS | Status: DC | PRN
  Administered 2018-09-28: 3

## 2018-09-28 MED ORDER — ONDANSETRON HCL 4 MG/2ML IJ SOLN
INTRAMUSCULAR | Status: DC | PRN
  Administered 2018-09-28: 4 mg via INTRAVENOUS

## 2018-09-28 MED ORDER — KETOROLAC TROMETHAMINE 30 MG/ML IJ SOLN
INTRAMUSCULAR | Status: AC
  Filled 2018-09-28: qty 1

## 2018-09-28 MED ORDER — OXYCODONE HCL 5 MG/5ML PO SOLN
5.0000 mg | Freq: Once | ORAL | Status: DC | PRN
  Filled 2018-09-28: qty 5

## 2018-09-28 SURGICAL SUPPLY — 25 items
BIPOLAR CUTTING LOOP 21FR (ELECTRODE)
CANISTER SUCT 3000ML PPV (MISCELLANEOUS) ×3 IMPLANT
CATH ROBINSON RED A/P 16FR (CATHETERS) IMPLANT
COVER WAND RF STERILE (DRAPES) ×3 IMPLANT
DEVICE MYOSURE LITE (MISCELLANEOUS) IMPLANT
DEVICE MYOSURE REACH (MISCELLANEOUS) IMPLANT
DILATOR CANAL MILEX (MISCELLANEOUS) ×3 IMPLANT
GAUZE 4X4 16PLY RFD (DISPOSABLE) ×3 IMPLANT
GLOVE BIO SURGEON STRL SZ7 (GLOVE) ×3 IMPLANT
GLOVE BIOGEL PI IND STRL 7.0 (GLOVE) ×1 IMPLANT
GLOVE BIOGEL PI INDICATOR 7.0 (GLOVE) ×2
GLOVE ECLIPSE 6.5 STRL STRAW (GLOVE) ×3 IMPLANT
GOWN STRL REUS W/ TWL LRG LVL3 (GOWN DISPOSABLE) ×1 IMPLANT
GOWN STRL REUS W/ TWL XL LVL3 (GOWN DISPOSABLE) ×1 IMPLANT
GOWN STRL REUS W/TWL LRG LVL3 (GOWN DISPOSABLE) ×2
GOWN STRL REUS W/TWL XL LVL3 (GOWN DISPOSABLE) ×2
IV NS IRRIG 3000ML ARTHROMATIC (IV SOLUTION) ×3 IMPLANT
KIT PROCEDURE FLUENT (KITS) ×3 IMPLANT
KIT TURNOVER CYSTO (KITS) ×3 IMPLANT
LOOP CUTTING BIPOLAR 21FR (ELECTRODE) IMPLANT
PACK VAGINAL MINOR WOMEN LF (CUSTOM PROCEDURE TRAY) ×3 IMPLANT
PAD OB MATERNITY 4.3X12.25 (PERSONAL CARE ITEMS) ×3 IMPLANT
SEAL ROD LENS SCOPE MYOSURE (ABLATOR) ×3 IMPLANT
TOWEL OR 17X24 6PK STRL BLUE (TOWEL DISPOSABLE) ×3 IMPLANT
WATER STERILE IRR 500ML POUR (IV SOLUTION) IMPLANT

## 2018-09-28 NOTE — Progress Notes (Addendum)
Anisocoria noted prior to start of surgery.  Patients  LEFT pupil 5-7 mm  RIGHT pupil 3 mm.  Both were poorly reactive to light.  Pt states she has not noticed this before but has had cataract surgery in her Left eye and did apply drops yesterday to Left eye.  Pt neurologically has no other changes,  Articulates and is otherwise asymptomatic.  I see no contraindication to proceeding with Surgery.  [place] Md.

## 2018-09-28 NOTE — Anesthesia Postprocedure Evaluation (Signed)
Anesthesia Post Note  Patient: Jill Cross  Procedure(s) Performed: DILATATION & CURETTAGE/HYSTEROSCOPY WITH MYOSURE  possible Myosure (N/A Vagina )     Patient location during evaluation: PACU Anesthesia Type: General Level of consciousness: awake Pain management: pain level controlled Vital Signs Assessment: post-procedure vital signs reviewed and stable Respiratory status: spontaneous breathing Cardiovascular status: stable Postop Assessment: no apparent nausea or vomiting Anesthetic complications: no    Last Vitals:  Vitals:   09/28/18 1515 09/28/18 1600  BP: 116/73 112/74  Pulse: 79 69  Resp: 17 16  Temp:  36.6 C  SpO2: 99% 100%    Last Pain:  Vitals:   09/28/18 1600  TempSrc: Oral  PainSc: 0-No pain                 Kelia Gibbon

## 2018-09-28 NOTE — Anesthesia Preprocedure Evaluation (Addendum)
Anesthesia Evaluation  Patient identified by MRN, date of birth, ID band Patient awake    Reviewed: Allergy & Precautions, H&P , NPO status , Patient's Chart, lab work & pertinent test results, reviewed documented beta blocker date and time   Airway Mallampati: II  TM Distance: >3 FB Neck ROM: full    Dental no notable dental hx.    Pulmonary asthma ,    Pulmonary exam normal breath sounds clear to auscultation       Cardiovascular Exercise Tolerance: Good negative cardio ROS   Rhythm:regular Rate:Normal     Neuro/Psych PSYCHIATRIC DISORDERS Anxiety  Neuromuscular disease    GI/Hepatic negative GI ROS, Neg liver ROS,   Endo/Other  Hypothyroidism   Renal/GU negative Renal ROS  negative genitourinary   Musculoskeletal  (+) Arthritis , Osteoarthritis,    Abdominal   Peds  Hematology  (+) Blood dyscrasia, anemia ,   Anesthesia Other Findings Anisocoria noted prior to start of surgery.  Patients  LEFT pupil 5-7 mm  RIGHT pupil 3 mm.  Both were poorly reactive to light.  Pt states she has not noticed this before but has had cataract surgery in her Left eye and did apply drops yesterday to Left eye.   Reproductive/Obstetrics negative OB ROS                            Anesthesia Physical Anesthesia Plan  ASA: II  Anesthesia Plan: General   Post-op Pain Management:    Induction: Intravenous  PONV Risk Score and Plan: 3 and Treatment may vary due to age or medical condition, Ondansetron, Midazolam and Dexamethasone  Airway Management Planned: LMA  Additional Equipment:   Intra-op Plan:   Post-operative Plan: Extubation in OR  Informed Consent: I have reviewed the patients History and Physical, chart, labs and discussed the procedure including the risks, benefits and alternatives for the proposed anesthesia with the patient or authorized representative who has indicated his/her  understanding and acceptance.   Dental Advisory Given  Plan Discussed with: CRNA, Anesthesiologist and Surgeon  Anesthesia Plan Comments: ( )        Anesthesia Quick Evaluation

## 2018-09-28 NOTE — Discharge Instructions (Signed)
°  Post Anesthesia Home Care Instructions  Activity: Get plenty of rest for the remainder of the day. A responsible adult should stay with you for 24 hours following the procedure.  For the next 24 hours, DO NOT: -Drive a car -Paediatric nurse -Drink alcoholic beverages -Take any medication unless instructed by your physician -Make any legal decisions or sign important papers.  Meals: Start with liquid foods such as gelatin or soup. Progress to regular foods as tolerated. Avoid greasy, spicy, heavy foods. If nausea and/or vomiting occur, drink only clear liquids until the nausea and/or vomiting subsides. Call your physician if vomiting continues.  Special Instructions/Symptoms: Your throat may feel dry or sore from the anesthesia or the breathing tube placed in your throat during surgery. If this causes discomfort, gargle with warm salt water. The discomfort should disappear within 24 hours.  If you had a scopolamine patch placed behind your ear for the management of post- operative nausea and/or vomiting:  1. The medication in the patch is effective for 72 hours, after which it should be removed.  Wrap patch in a tissue and discard in the trash. Wash hands thoroughly with soap and water. 2. You may remove the patch earlier than 72 hours if you experience unpleasant side effects which may include dry mouth, dizziness or visual disturbances. 3. Avoid touching the patch. Wash your hands with soap and water after contact with the patch.    No advil, aleve, motrin, ibuprofen until 8:30 pm tonight

## 2018-09-28 NOTE — Progress Notes (Signed)
Initial vicodin rx was printed.  This was discarded by shredding and repeat rx sent to pharmacy electronically.

## 2018-09-28 NOTE — H&P (Signed)
Jill Cross is an 58 y.o. female G2P2 MWF here for hysteroscopy with dilation and curettage due to recurrent PMP bleeding. She's undergone an endometrial biopsy and ultrasound without any significant abnormal finding.  She is here for procedure today for additional evaluation.    Pertinent Gynecological History: Menses: post-menopausal Bleeding: post menopausal bleeding Contraception: post menopausal status DES exposure: denies Blood transfusions: none Sexually transmitted diseases: no past history Previous GYN Procedures: nond  Last mammogram: normal Date: 09/23/17 Last pap: normal Date: 08/03/18 OB History: G2, P2   Menstrual History: Patient's last menstrual period was 09/09/2016 (exact date).    Past Medical History:  Diagnosis Date  . Anemia   . Arthritis   . Asthma   . Blepharitis   . Cataract    Bilateral  . Cherry angioma    trunk and extremities  . DDD (degenerative disc disease), lumbar   . Eczema   . Fibroids   . Frequent UTI    post coital  . 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 GAD (generalized anxiety disorder)   . Hypothyroidism   . Lipoma 04/30/2012   Rib cage and knee  . Lumbar herniated disc   . Meibomian gland dysfunction (MGD)   . Plantar wart of right foot   . Positional vertigo   . Rosacea   . Shingles     Past Surgical History:  Procedure Laterality Date  . CAPSULOTOMY Right    EYE, Posterior  . CATARACT EXTRACTION, BILATERAL    . corneal tissue transplants     both eyes  . KNEE ARTHROSCOPY Left 11/2017   Open cyst excision  . URETHRAL STRICTURE DILATATION     age 28  . WISDOM TOOTH EXTRACTION      Family History  Problem Relation Age of Onset  . Hypertension Mother   . Hyperlipidemia Mother   . Ovarian cancer Mother 1  . Hyperlipidemia Father   . ALS Father   . Prostate cancer Maternal Grandfather   . Breast cancer Other        MGGM  . Breast cancer Other        Mother's first cousin  . Breast cancer  Other        Maternal great aunt    Social History:  reports that she has never smoked. She has never used smokeless tobacco. She reports current alcohol use of about 1.0 standard drinks of alcohol per week. She reports that she does not use drugs.  Allergies:  Allergies  Allergen Reactions  . Penicillins Hives    Medications Prior to Admission  Medication Sig Dispense Refill Last Dose  . Azelaic Acid (FINACEA) 15 % cream MASSAGE A THIN FILM OF GEL INTO THE AFFECTED AREA TWICE DAILY.   09/27/2018 at Unknown time  . buPROPion (WELLBUTRIN XL) 300 MG 24 hr tablet Take by mouth every morning.    09/27/2018 at Unknown time  . fexofenadine-pseudoephedrine (ALLEGRA-D) 60-120 MG 12 hr tablet Take 1 tablet by mouth 2 (two) times daily.   09/27/2018 at Unknown time  . Lactobacillus (PROBIOTIC ACIDOPHILUS PO) Take by mouth every evening.   09/27/2018 at Unknown time  . levothyroxine (SYNTHROID, LEVOTHROID) 25 MCG tablet TAKE 1 TABLET BY MOUTH EVERY DAY IN THE MORNING ON EMPTY STOMACH  0 09/27/2018 at Unknown time  . LORazepam (ATIVAN) 0.5 MG tablet Take 1 tablet (0.5 mg total) by mouth as needed for anxiety. 30 tablet 0 Past Week at Unknown time  . nitrofurantoin, macrocrystal-monohydrate, (  MACROBID) 100 MG capsule Take one post coital and repeat one in 12 hours if needed and symptoms present 30 capsule 6 Past Week at Unknown time  . Vitamin D, Cholecalciferol, 1000 units CAPS Take by mouth every evening.    09/27/2018 at Unknown time  . albuterol (PROVENTIL HFA) 108 (90 Base) MCG/ACT inhaler Inhale into the lungs as needed.    Unknown at Unknown time  . meclizine (ANTIVERT) 25 MG tablet Take 25 mg by mouth 3 (three) times daily as needed for dizziness.    Unknown at Unknown time  . montelukast (SINGULAIR) 10 MG tablet As needed for colds   Unknown at Unknown time  . promethazine (PHENERGAN) 25 MG tablet Take 25 mg by mouth as needed for nausea.    Unknown at Unknown time    Review of Systems   Psychiatric/Behavioral: The patient is nervous/anxious.   All other systems reviewed and are negative.   Blood pressure 109/75, pulse 70, temperature 98.7 F (37.1 C), temperature source Oral, resp. rate 14, height 5\' 5"  (1.651 m), weight 73.8 kg, last menstrual period 09/09/2016, SpO2 100 %. Physical Exam  Constitutional: She appears well-developed and well-nourished.  Eyes: Lids are normal. Right conjunctiva is not injected. Right conjunctiva has no hemorrhage. Left conjunctiva is not injected. Left conjunctiva has no hemorrhage. Right pupil is round and reactive. Left pupil is round and reactive. Pupils are unequal.  Cardiovascular: Normal rate and regular rhythm.  Respiratory: Effort normal and breath sounds normal.    Results for orders placed or performed during the hospital encounter of 09/28/18 (from the past 24 hour(s))  CBC     Status: None   Collection Time: 09/28/18  9:10 AM  Result Value Ref Range   WBC 6.0 4.0 - 10.5 K/uL   RBC 4.68 3.87 - 5.11 MIL/uL   Hemoglobin 14.4 12.0 - 15.0 g/dL   HCT 42.6 36.0 - 46.0 %   MCV 91.0 80.0 - 100.0 fL   MCH 30.8 26.0 - 34.0 pg   MCHC 33.8 30.0 - 36.0 g/dL   RDW 12.0 11.5 - 15.5 %   Platelets 196 150 - 400 K/uL   nRBC 0.0 0.0 - 0.2 %    No results found.  Assessment/Plan: 58 yo G2P2 MWF with recurrent PMP bleeding her for hysteroscopy with dilation and curettage.  Risks, benefits, alternatives have been reviewed.  Pt is here and ready to proceed.  Megan Salon 09/28/2018, 1:36 PM

## 2018-09-28 NOTE — Transfer of Care (Signed)
Immediate Anesthesia Transfer of Care Note  Patient: Jill Cross  Procedure(s) Performed: Procedure(s) (LRB): DILATATION & CURETTAGE/HYSTEROSCOPY WITH MYOSURE  possible Myosure (N/A)  Patient Location: PACU  Anesthesia Type: General  Level of Consciousness: awake, sedated, patient cooperative and responds to stimulation  Airway & Oxygen Therapy: Patient Spontanous Breathing and Patient on RA - awake   Post-op Assessment: Report given to PACU RN, Post -op Vital signs reviewed and stable and Patient moving all extremities Left eye remains dilated more than right eye - as the same preop . Dr Nyoka Cowden covering MDA - she was made aware of concern   Post vital signs: Reviewed and stable  Complications: No apparent anesthesia complications

## 2018-09-28 NOTE — OR Nursing (Signed)
At pre-op, pupils were unequal, left greater than right per [facility]. She notified Anesthesiologist and Surgeon.

## 2018-09-28 NOTE — Anesthesia Procedure Notes (Signed)
Procedure Name: LMA Insertion Date/Time: 09/28/2018 2:03 PM Performed by: Justice Rocher, CRNA Pre-anesthesia Checklist: Patient identified, Emergency Drugs available, Suction available and Patient being monitored Patient Re-evaluated:Patient Re-evaluated prior to induction Oxygen Delivery Method: Circle system utilized Preoxygenation: Pre-oxygenation with 100% oxygen Induction Type: IV induction Ventilation: Mask ventilation without difficulty LMA: LMA inserted LMA Size: 4.0 Number of attempts: 1 Airway Equipment and Method: Bite block Placement Confirmation: positive ETCO2 and breath sounds checked- equal and bilateral Tube secured with: Tape Dental Injury: Teeth and Oropharynx as per pre-operative assessment

## 2018-09-28 NOTE — Op Note (Signed)
09/28/2018  3:01 PM  PATIENT:  Jill Cross  58 y.o. female  PRE-OPERATIVE DIAGNOSIS:  Persistent PMB, family history breast and ovaraian cancer  POST-OPERATIVE DIAGNOSIS:  Persistent PMB, family history breast and ovaraian cancer  PROCEDURE:  Procedure(s): DILATATION & CURETTAGE/HYSTEROSCOPY WITH MYOSURE  possible Myosure  SURGEON:  Megan Salon  ASSISTANTS: OR staff   ANESTHESIA:   general  ESTIMATED BLOOD LOSS: 5 mL  BLOOD ADMINISTERED:none   FLUIDS: 1000cc LR  UOP: voided right before going back to OR  SPECIMEN:  Endometrial curettings  DISPOSITION OF SPECIMEN:  PATHOLOGY  FINDINGS: atrophic appearing endometrium, normal cervical canal  DESCRIPTION OF OPERATION: Patient was taken to the operating room.  She is placed in the supine position. SCDs were on her lower extremities and functioning properly. General anesthesia with an LMA was administered without difficulty. Dr. Rayetta Pigg, anesthesia, oversaw case.  Legs were then placed in the [place] in the low lithotomy position. The legs were lifted to the high lithotomy position and the Betadine prep was used on the inner thighs perineum and vagina x3. Patient was draped in a normal standard fashion. A bivalve speculum was placed the vagina. The anterior lip of the cervix was grasped with single-tooth tenaculum.  A paracervical block of 1% lidocaine mixed one-to-one with epinephrine (1:100,000 units).  10 cc was used total. The cervix is dilated up to #19 [facility] dilators. The endometrial cavity sounded to 7.5 cm.   A Myosure hysteroscope was obtained. Normal saline was used as a hysteroscopic fluid. The hysteroscope was advanced through the endocervical canal into the endometrial cavity. The tubal ostia were noted bilaterally. Additional findings included atrophic appearing endometrium without any intracavitary lesions.  The hysteroscope was removed. A #1 toothed curette was used to curette the cavity until rough  gritty texture was noted in all quadrants. With revisualization of the hysteroscope, there was evident of the curettage occurring in all quadrants.  At this point no other procedure was needed and this procedure was ended. The hysteroscope was removed. The fluid deficit was 55 cc. The tenaculum was removed from the anterior lip of the cervix. The speculum was removed from the vagina. The prep was cleansed of the patient's skin. The legs are positioned back in the supine position. Sponge, lap, needle, initially counts were correct x2. Patient was taken to recovery in stable condition.  COUNTS:  YES  PLAN OF CARE: Transfer to PACU

## 2018-09-30 ENCOUNTER — Ambulatory Visit
Admission: RE | Admit: 2018-09-30 | Discharge: 2018-09-30 | Disposition: A | Discharge status: Home / Self Care | Payer: BC Managed Care – PPO | Source: Ambulatory Visit | Attending: Certified Nurse Midwife | Admitting: Certified Nurse Midwife

## 2018-09-30 ENCOUNTER — Ambulatory Visit: Payer: BC Managed Care – PPO

## 2018-09-30 ENCOUNTER — Encounter (HOSPITAL_BASED_OUTPATIENT_CLINIC_OR_DEPARTMENT_OTHER): Payer: Self-pay | Admitting: Obstetrics & Gynecology

## 2018-09-30 DIAGNOSIS — Z1231 Encounter for screening mammogram for malignant neoplasm of breast: Secondary | ICD-10-CM

## 2018-10-01 ENCOUNTER — Telehealth: Payer: Self-pay | Admitting: Obstetrics & Gynecology

## 2018-10-01 NOTE — Telephone Encounter (Signed)
Patient called to give Dr. Sabra Heck an update after seeing an eye doctor for an enlarged pupil after surgery. 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 She said she is taking an antiviral at this point and will follow up if it does not get better.

## 2018-10-01 NOTE — Telephone Encounter (Signed)
Routing to Dr. Miller FYI.  

## 2018-10-05 ENCOUNTER — Ambulatory Visit: Payer: BC Managed Care – PPO

## 2018-10-12 ENCOUNTER — Encounter: Payer: Self-pay | Admitting: Obstetrics & Gynecology

## 2018-10-12 ENCOUNTER — Other Ambulatory Visit: Payer: Self-pay

## 2018-10-12 ENCOUNTER — Ambulatory Visit (INDEPENDENT_AMBULATORY_CARE_PROVIDER_SITE_OTHER): Payer: BC Managed Care – PPO | Admitting: Obstetrics & Gynecology

## 2018-10-12 VITALS — BP 96/70 | HR 80 | Resp 16 | Ht 65.0 in | Wt 164.0 lb

## 2018-10-12 DIAGNOSIS — N840 Polyp of corpus uteri: Secondary | ICD-10-CM | POA: Diagnosis not present

## 2018-10-12 NOTE — Progress Notes (Signed)
Post Operative Visit  Procedure: Dilatation and Curettage/ Hysteroscopy  Days Post-op: 14  Subjective: Doing well since procedure.  Denies bleeding but had light spotting for about three days post op.  This stopped and then she spotted a few more days.  Really had no post op cramping/pain.  Took two prescription advil but she never needed any narcotic.  Bladder function is normal.    Pictures and pathology reviewed today.    Left eye pupil is still larger than right eye pupil.  She did see an ophthalmologist who thought this was due to either prior eye surgery or viral.  She was prescribed Valtrex TID x 7 days.  She is seeing the eye surgeon in early March.  Is also on the cancellation list to be seen sooner if possible.    Objective: BP 96/70 (BP Location: Right Arm, Patient Position: Sitting, Cuff Size: Normal)   Pulse 80   Resp 16   Ht 5\' 5"  (1.651 m)   Wt 164 lb (74.4 kg)   LMP 09/09/2016 (Exact Date)   BMI 27.29 kg/m   EXAM General: alert and no distress Resp: clear to auscultation bilaterally Cardio: regular rate and rhythm, S1, S2 normal, no murmur, click, rub or gallop GI: soft, non-tender; bowel sounds normal; no masses,  no organomegaly  Gyn:  NAEFG, vaginal without lesions, blood or discharge, cervix closed, uterus non tender Extremities: extremities normal, atraumatic, no cyanosis or edema Vaginal Bleeding: none  Assessment: s/p hysteroscopy with D&C, pathology showed endometrial polyp  Plan: F/u for AEX.  Pt knows to call with any future bleeding or concerns.

## 2018-10-21 ENCOUNTER — Other Ambulatory Visit: Payer: BC Managed Care – PPO

## 2018-10-21 ENCOUNTER — Ambulatory Visit: Payer: BC Managed Care – PPO | Admitting: Certified Nurse Midwife

## 2018-10-21 ENCOUNTER — Other Ambulatory Visit: Payer: Self-pay | Admitting: Obstetrics & Gynecology

## 2018-10-21 DIAGNOSIS — Z8041 Family history of malignant neoplasm of ovary: Secondary | ICD-10-CM

## 2018-10-21 DIAGNOSIS — N950 Postmenopausal bleeding: Secondary | ICD-10-CM

## 2018-10-22 LAB — CA 125: Cancer Antigen (CA) 125: 17.1 U/mL (ref 0.0–38.1)

## 2018-11-16 DIAGNOSIS — H57052 Tonic pupil, left eye: Secondary | ICD-10-CM | POA: Insufficient documentation

## 2019-03-14 IMAGING — MG DIGITAL SCREENING BILATERAL MAMMOGRAM WITH TOMO AND CAD
8 series · 9 of 24 positions shown · non-contrast
Comparison: Previous exam(s).

CLINICAL DATA: Screening.

EXAM:
DIGITAL SCREENING BILATERAL MAMMOGRAM WITH TOMO AND CAD

[L MLO synth-2D]
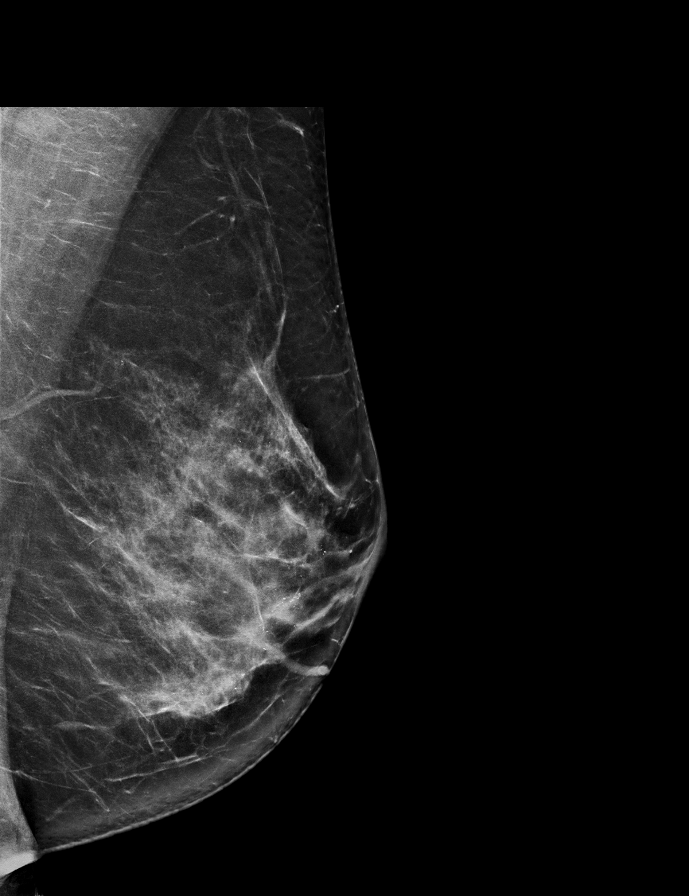

[R CC synth-2D]
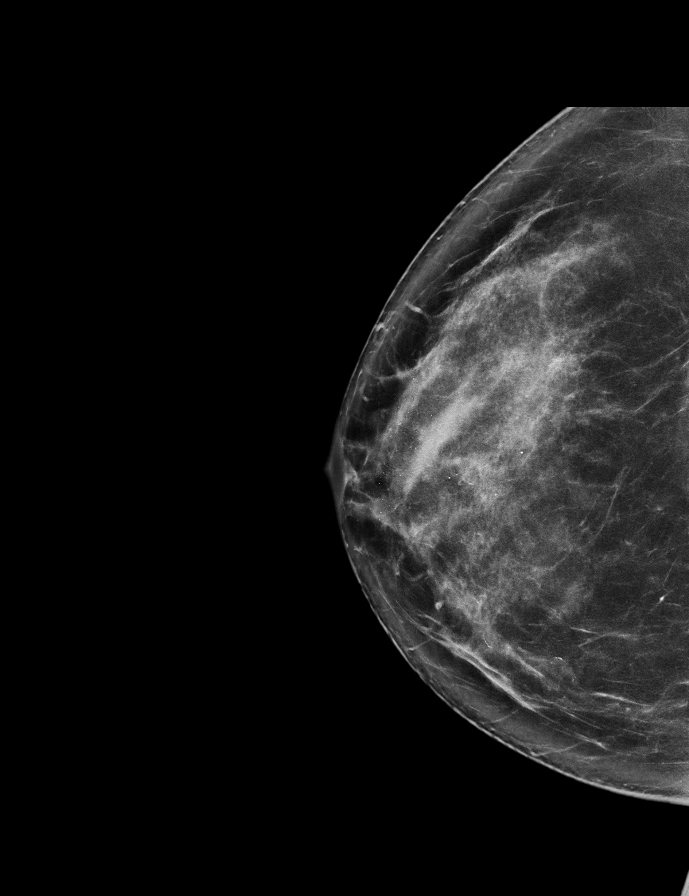

[L CC synth-2D]
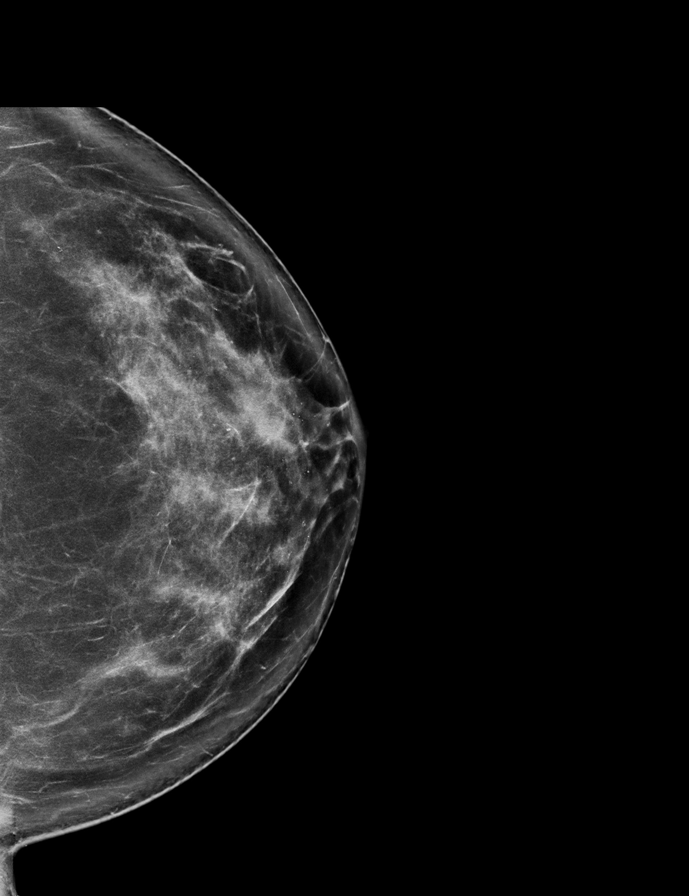

[R MLO synth-2D]
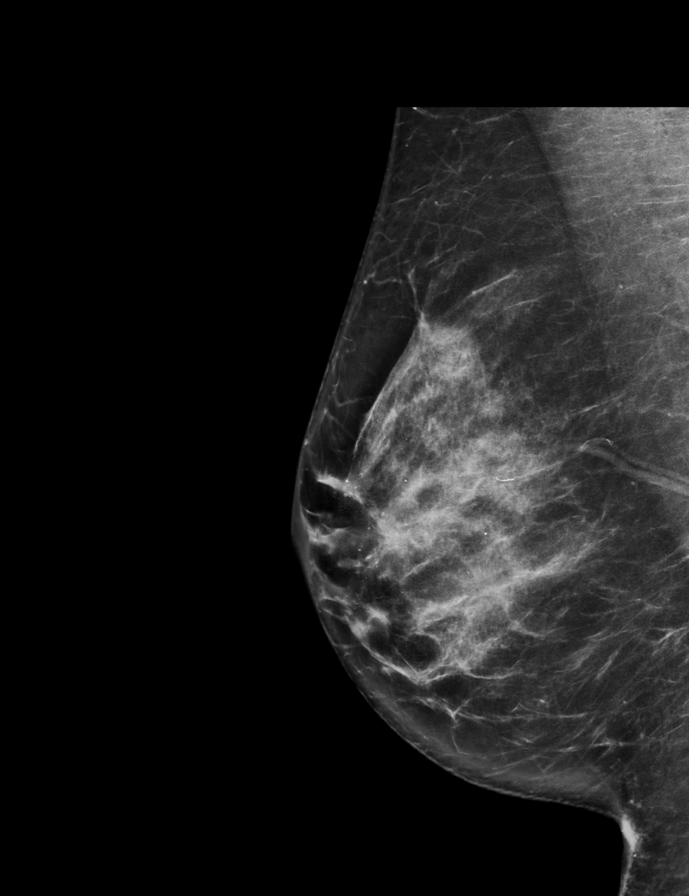

[L MLO tomo · 2 of 80 frames shown]
[frame 26/80]
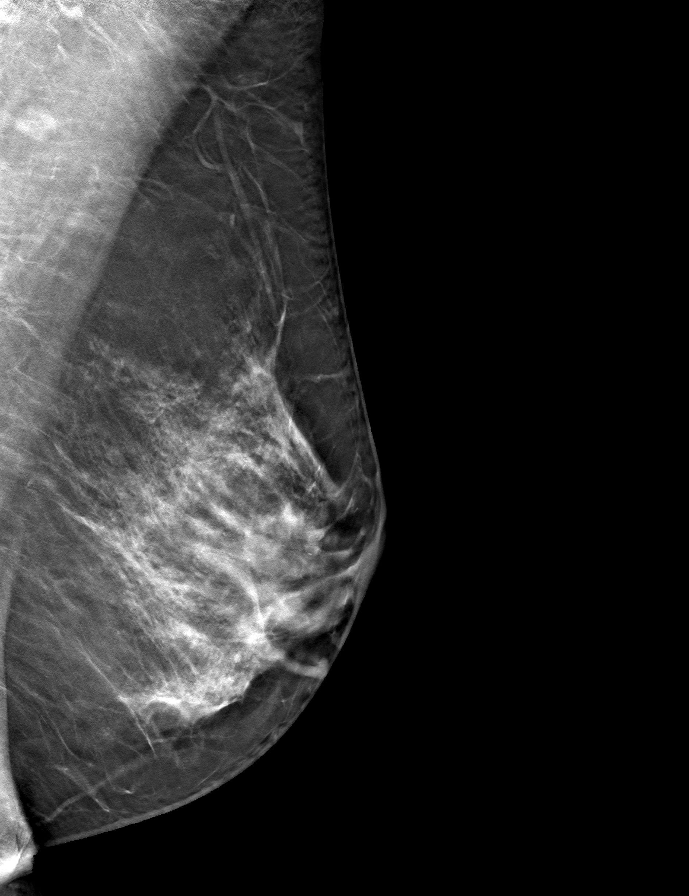
[frame 41/80]
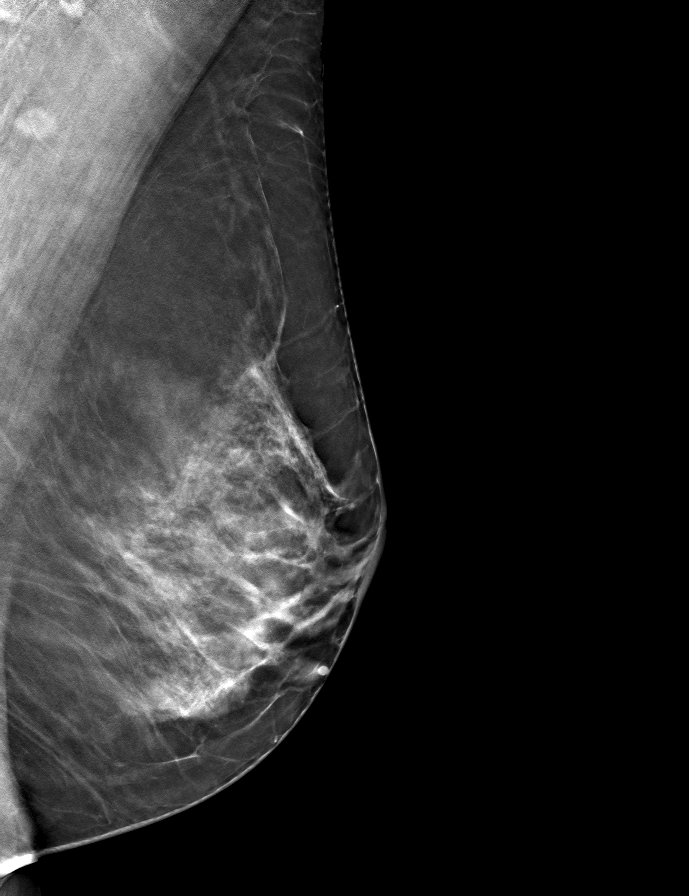

[R MLO tomo · tomo slice 39/77.0]
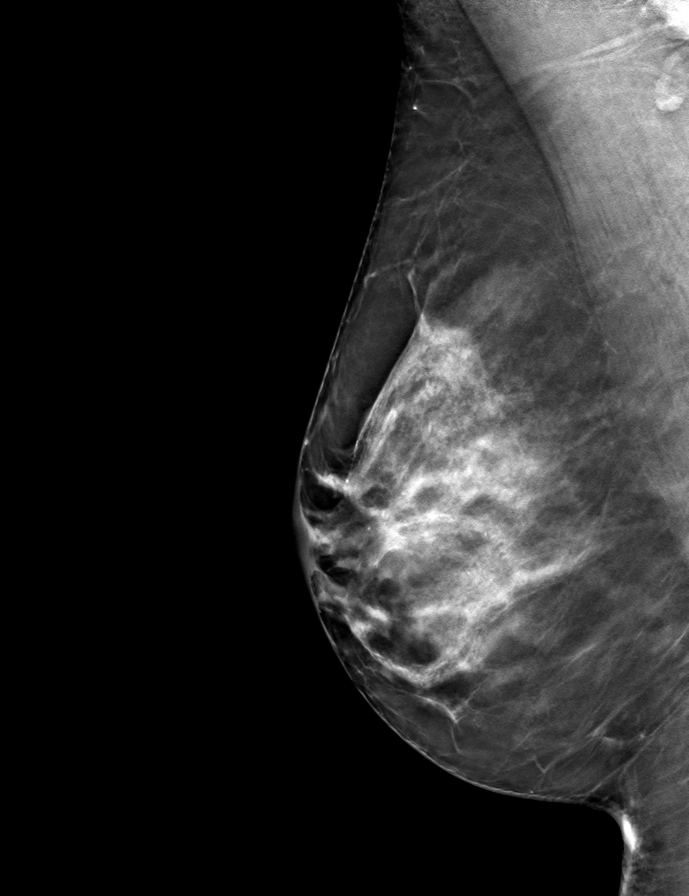

[R CC tomo · tomo slice 41/80.0]
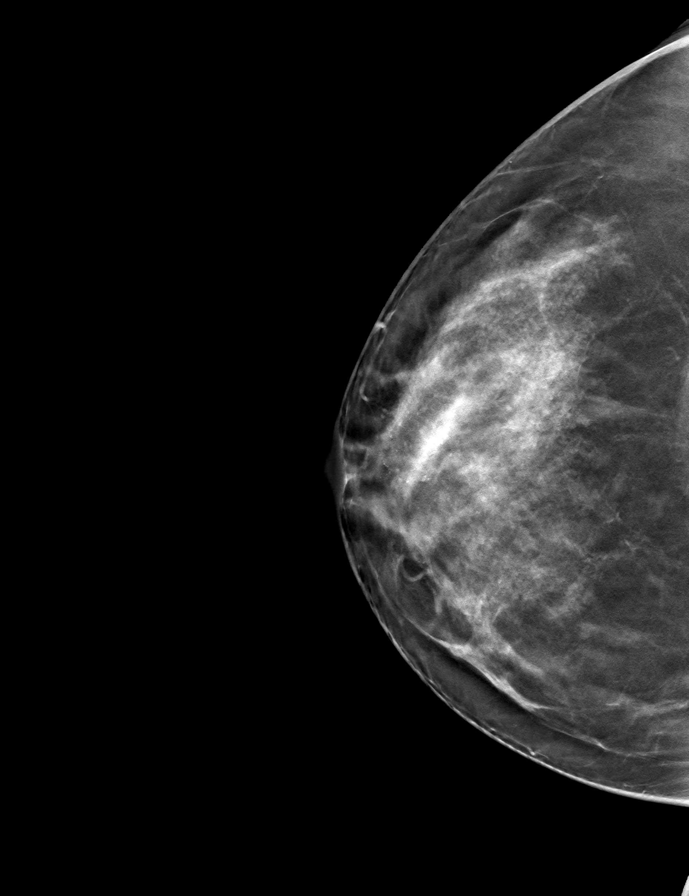

[L CC tomo · tomo slice 43/85.0]
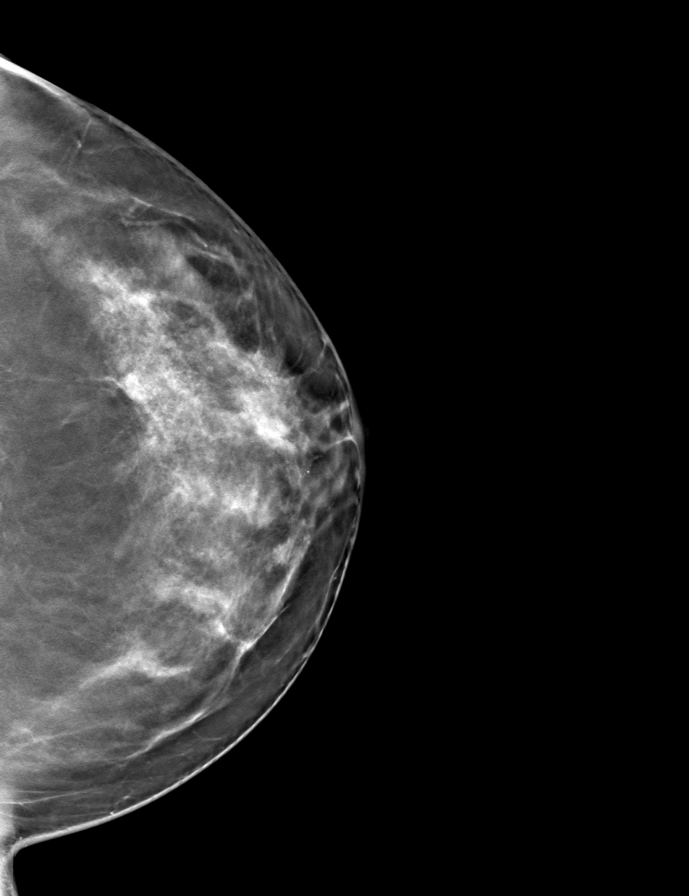

[9 of 24 positions shown; findings below may reference images not displayed]

ACR Breast Density Category c: The breast tissue is heterogeneously
dense, which may obscure small masses.
FINDINGS: There are no findings suspicious for malignancy. Images were
processed with CAD.
IMPRESSION: No mammographic evidence of malignancy. A result letter of this
screening mammogram will be mailed directly to the patient.

RECOMMENDATION:
Screening mammogram in one year. (Code:[id number]-[id number])

BI-RADS CATEGORY  1: Negative.

## 2019-05-18 DIAGNOSIS — E039 Hypothyroidism, unspecified: Secondary | ICD-10-CM | POA: Insufficient documentation

## 2019-07-14 DIAGNOSIS — J3081 Allergic rhinitis due to animal (cat) (dog) hair and dander: Secondary | ICD-10-CM | POA: Insufficient documentation

## 2019-08-09 ENCOUNTER — Other Ambulatory Visit: Payer: Self-pay

## 2019-08-09 MED ORDER — NITROFURANTOIN MONOHYD MACRO 100 MG PO CAPS: ORAL_CAPSULE | ORAL | 1 refills | Status: DC

## 2019-08-09 NOTE — Telephone Encounter (Signed)
Medication refill request: Microbid  Last AEX: 10/14/17   Next AEX: 10/27/19 Last MMG (if hormonal medication request): NA Refill authorized: #[id number] with 6 RF pended

## 2019-08-18 ENCOUNTER — Ambulatory Visit: Payer: BC Managed Care – PPO | Admitting: Certified Nurse Midwife

## 2019-10-27 ENCOUNTER — Ambulatory Visit: Payer: BC Managed Care – PPO | Admitting: Certified Nurse Midwife

## 2019-11-09 ENCOUNTER — Telehealth: Payer: Self-pay | Admitting: Certified Nurse Midwife

## 2019-11-09 DIAGNOSIS — Z8041 Family history of malignant neoplasm of ovary: Secondary | ICD-10-CM

## 2019-11-09 NOTE — Telephone Encounter (Signed)
Patient asked if it is possible to have a PUS at her next aex appointment.

## 2019-11-10 NOTE — Telephone Encounter (Signed)
Spoke back with pt. Pt wanting to schedule annual PUS d/t family hx (mother) of ovarian cancer. Pt is with [name], CNM and informed of her retirement. Pt has seen Dr Sabra Heck in past for PUS.  Pt cancelled AEX in April due to Covid and wanting appt further out. Pt states has moved ,but wanting to keep our office as GYN. Pt requesting AEX and PUS on same day.   Pt scheduled for AEX with Dr Sabra Heck on 5/13 at 1030 am and PUS with OV to follow on 5/13 at 1230 pm. Pt agreeable. Pt aware of call for benefits.    Routing to Dr Sabra Heck for review and Johny Shock, CNM for [facility].  Cc: Rosa and [place] for [employer]. Orders placed.

## 2019-11-10 NOTE — Telephone Encounter (Signed)
Patient returning call to Stephanie.  

## 2019-11-10 NOTE — Telephone Encounter (Signed)
Left message for pt to call back to Melika Reder, RN in triage. 

## 2019-12-08 ENCOUNTER — Encounter: Payer: Self-pay | Admitting: Certified Nurse Midwife

## 2019-12-22 ENCOUNTER — Ambulatory Visit: Payer: BC Managed Care – PPO | Admitting: Certified Nurse Midwife

## 2020-01-24 NOTE — Progress Notes (Addendum)
59 y.o. G29P2002 Married Other or two or more races female here for annual exam.  Denies vaginal bleeding.  Did have [place] vaccination.    Recently moved to [place] to be closer to her children.  Her daughters are planning on pregnancy in the future.  Is transitioning her care to [place].    Patient's last menstrual period was 09/09/2016 (exact date).          Sexually active: Yes.    The current method of family planning is post menopausal status.    Exercising: Yes.    walking 3 miles daily, [place], weights Smoker:  no  Health Maintenance: Pap:  08-30-14 neg, 10-10-16 neg HPV HR neg, 08-03-18 neg  History of abnormal Pap:  no MMG:  01-12-2020 birads 2:neg Colonoscopy:  2013 f/u 10 yrs BMD:   none TDaP:  2017 Pneumonia vaccine(s):  2018 Shingrix:   Had done Hep C testing: hep c neg 2017 Screening Labs: done with PCP   reports that she has never smoked. She has never used smokeless tobacco. She reports previous alcohol use. She reports that she does not use drugs.  Past Medical History:  Diagnosis Date  . Anemia   . Arthritis   . Asthma   . Blepharitis   . Cataract    Bilateral  . Cherry angioma    trunk and extremities  . DDD (degenerative disc disease), lumbar   . Eczema   . Fibroids   . Frequent UTI    post coital  . 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 GAD (generalized anxiety disorder)   . Hypothyroidism   . Lipoma 04/30/2012   Rib cage and knee  . Lumbar herniated disc   . Meibomian gland dysfunction (MGD)   . Plantar wart of right foot   . Positional vertigo   . Rosacea   . Shingles     Past Surgical History:  Procedure Laterality Date  . CAPSULOTOMY Right    EYE, Posterior  . CATARACT EXTRACTION, BILATERAL    . corneal tissue transplants     both eyes  . DILATATION & CURETTAGE/HYSTEROSCOPY WITH MYOSURE N/A 09/28/2018   Procedure: DILATATION & CURETTAGE/HYSTEROSCOPY;  Surgeon: Megan Salon, MD;  Location: [facility];   Service: Gynecology;  Laterality: N/A;  . KNEE ARTHROSCOPY Left 11/2017   Open cyst excision  . SHOULDER SURGERY Right   . URETHRAL STRICTURE DILATATION     age 83  . WISDOM TOOTH EXTRACTION      Current Outpatient Medications  Medication Sig Dispense Refill  . albuterol (PROVENTIL HFA) 108 (90 Base) MCG/ACT inhaler Inhale into the lungs as needed.     . Azelaic Acid (FINACEA) 15 % cream MASSAGE A THIN FILM OF GEL INTO THE AFFECTED AREA TWICE DAILY.    Marland Kitchen buPROPion (WELLBUTRIN XL) 300 MG 24 hr tablet Take by mouth every morning.     Marland Kitchen EPIPEN 2-PAK 0.3 MG/0.3ML SOAJ injection     . fexofenadine-pseudoephedrine (ALLEGRA-D) 60-120 MG 12 hr tablet Take 1 tablet by mouth 2 (two) times daily.    . Lactobacillus (PROBIOTIC ACIDOPHILUS PO) Take by mouth every evening.    Marland Kitchen levothyroxine (SYNTHROID, LEVOTHROID) 25 MCG tablet TAKE 1 TABLET BY MOUTH EVERY DAY IN THE MORNING ON EMPTY STOMACH  0  . LORazepam (ATIVAN) 0.5 MG tablet Take 1 tablet (0.5 mg total) by mouth as needed for anxiety. 30 tablet 0  . montelukast (SINGULAIR) 10 MG tablet As needed for colds    .  nitrofurantoin, macrocrystal-monohydrate, (MACROBID) 100 MG capsule Take one post coital and repeat one in 12 hours if needed and symptoms present 30 capsule 1  . tiZANidine (ZANAFLEX) 4 MG tablet     . Vitamin D, Cholecalciferol, 1000 units CAPS Take by mouth every evening.      No current facility-administered medications for this visit.    Family History  Problem Relation Age of Onset  . Hypertension Mother   . Hyperlipidemia Mother   . Ovarian cancer Mother 68  . Hyperlipidemia Father   . ALS Father   . Prostate cancer Maternal Grandfather   . Breast cancer Other        MGGM  . Breast cancer Other        Mother's first cousin  . Breast cancer Other        Maternal great aunt    Review of Systems  Constitutional: Negative.   HENT: Negative.   Eyes: Negative.   Respiratory: Negative.   Cardiovascular: Negative.    Gastrointestinal: Negative.   Endocrine: Negative.   Genitourinary: Negative.   Musculoskeletal: Negative.   Skin: Negative.   Allergic/Immunologic: Negative.   Neurological: Negative.   Psychiatric/Behavioral: Negative.     Exam:   BP 116/70   Pulse 68   Temp (!) 97 F (36.1 C) (Skin)   Resp 16   Ht 5' 6.75" (1.695 m)   Wt 153 lb (69.4 kg)   LMP 09/09/2016 (Exact Date)   BMI 24.14 kg/m   Height: 5' 6.75" (169.5 cm)  General appearance: alert, cooperative and appears stated age Head: Normocephalic, without obvious abnormality, atraumatic Neck: no adenopathy, supple, symmetrical, trachea midline and thyroid normal to inspection and palpation Lungs: clear to auscultation bilaterally Breasts: normal appearance, no masses or tenderness Heart: regular rate and rhythm Abdomen: soft, non-tender; bowel sounds normal; no masses,  no organomegaly Extremities: extremities normal, atraumatic, no cyanosis or edema Skin: Skin color, texture, turgor normal. No rashes or lesions Lymph nodes: Cervical, supraclavicular, and axillary nodes normal. No abnormal inguinal nodes palpated Neurologic: Grossly normal   Pelvic: External genitalia:  no lesions              Urethra:  normal appearing urethra with no masses, tenderness or lesions              Bartholins and Skenes: normal                 Vagina: normal appearing vagina with normal color and discharge, no lesions              Cervix: no lesions              Pap taken: Yes.   Bimanual Exam:  Uterus:  normal size, contour, position, consistency, mobility, non-tender              Adnexa: normal adnexa and no mass, fullness, tenderness               Rectovaginal: Confirms               Anus:  normal sphincter tone, no lesions  Chaperone, Royal Hawthorn, CMA, was present for exam.  A:  Well Woman with normal exam PMP, no HRT Vaginal atrophic changes Hypothyroidism Asthma Vit D deficiency Family hx of ovarian cancer in her mother,  desires ca-125.  We have discussed pros/cons of this  P:   Mammogram guidelines reviewed pap smear with HR HPV obtained today CBC, CMP, lipids, TSH, Vit D obtained  today Ca-125 obtained today Macrobid 100mg  post coitally.  #30/1RF RF for Ativan 0.5mg  tab prn anxiety.  #30/0RF Colonoscopy due 2023 Has ultrasound scheduled this afternoon.  This is be documented on a separate note. Return annually or prn  In additional to AEX, additional time spent reviewing prior ultrasound and comparing to ultrasound done today specifically reviewing for changes with fibroids.

## 2020-01-27 ENCOUNTER — Ambulatory Visit: Payer: BC Managed Care – PPO | Admitting: Obstetrics & Gynecology

## 2020-01-27 ENCOUNTER — Encounter: Payer: Self-pay | Admitting: Obstetrics & Gynecology

## 2020-01-27 ENCOUNTER — Ambulatory Visit (INDEPENDENT_AMBULATORY_CARE_PROVIDER_SITE_OTHER): Payer: BC Managed Care – PPO

## 2020-01-27 ENCOUNTER — Other Ambulatory Visit (HOSPITAL_COMMUNITY)
Admission: RE | Admit: 2020-01-27 | Discharge: 2020-01-27 | Disposition: A | Discharge status: Home / Self Care | Payer: BC Managed Care – PPO | Source: Ambulatory Visit | Attending: Obstetrics & Gynecology | Admitting: Obstetrics & Gynecology

## 2020-01-27 ENCOUNTER — Other Ambulatory Visit: Payer: Self-pay

## 2020-01-27 VITALS — BP 116/70 | HR 68 | Temp 97.0°F | Resp 16 | Ht 66.75 in | Wt 153.0 lb

## 2020-01-27 DIAGNOSIS — Z124 Encounter for screening for malignant neoplasm of cervix: Secondary | ICD-10-CM

## 2020-01-27 DIAGNOSIS — Z0000 Encounter for general adult medical examination without abnormal findings: Secondary | ICD-10-CM | POA: Diagnosis not present

## 2020-01-27 DIAGNOSIS — R971 Elevated cancer antigen 125 [CA 125]: Secondary | ICD-10-CM | POA: Diagnosis not present

## 2020-01-27 DIAGNOSIS — Z01419 Encounter for gynecological examination (general) (routine) without abnormal findings: Secondary | ICD-10-CM | POA: Diagnosis not present

## 2020-01-27 DIAGNOSIS — Z8041 Family history of malignant neoplasm of ovary: Secondary | ICD-10-CM | POA: Diagnosis not present

## 2020-01-27 DIAGNOSIS — D251 Intramural leiomyoma of uterus: Secondary | ICD-10-CM

## 2020-01-27 MED ORDER — NITROFURANTOIN MONOHYD MACRO 100 MG PO CAPS: ORAL_CAPSULE | ORAL | 1 refills | Status: DC

## 2020-01-27 MED ORDER — LORAZEPAM 0.5 MG PO TABS: 0.5000 mg | ORAL_TABLET | ORAL | 0 refills | Status: AC | PRN

## 2020-01-27 NOTE — Patient Instructions (Addendum)
Dr. Elna Breslow [facility] [place] [place], [place] [address]  Phone: [phone]

## 2020-01-27 NOTE — Progress Notes (Signed)
59 y.o. G41P2002 Married Other or two or more races female here for pelvic ultrasound due to family hx of ovarian cancer in her mother..  Patient's last menstrual period was 09/09/2016 (exact date).  Contraception: PMP  Findings:  UTERUS: 6.5 x 3.7 x 3.5cm with 1.0 and 1.5cm fibroids EMS: ~20mm ADNEXA: Left ovary: 2.0 x 1.1 x 0.7cm       Right ovary: 2.3 x 1.3 x 0.9c CUL DE SAC: no free fluid  Discussion:  Images reviewed with pt and findings discussed.  Prior ultrasound from 10/30/17 reviewed.  Fibroids measured 1.9 x 1.7cm and 0.9 x 0.7cm so have decreased a little in size.  Assessment:  Family hx of ovarian cancer2  Plan:  Consider repeating PUS in 2 years.  Ca-125 obtained earlier today.  Pt and I have discussed there is no screening protocol for family hx of ovarian cancer in mother but am comfortable with this plan as is the pt.

## 2020-01-28 LAB — COMPREHENSIVE METABOLIC PANEL
ALT: 17 IU/L (ref 0–32)
AST: 22 IU/L (ref 0–40)
Albumin/Globulin Ratio: 1.8 (ref 1.2–2.2)
Albumin: 4.4 g/dL (ref 3.8–4.9)
Alkaline Phosphatase: 106 IU/L (ref 39–117)
BUN/Creatinine Ratio: 21 (ref 9–23)
BUN: 14 mg/dL (ref 6–24)
Bilirubin Total: 0.3 mg/dL (ref 0.0–1.2)
CO2: 24 mmol/L (ref 20–29)
Calcium: 9.3 mg/dL (ref 8.7–10.2)
Chloride: 99 mmol/L (ref 96–106)
Creatinine, Ser: 0.67 mg/dL (ref 0.57–1.00)
GFR calc Af Amer: 113 mL/min/{1.73_m2} (ref >59)
GFR calc non Af Amer: 98 mL/min/{1.73_m2} (ref >59)
Globulin, Total: 2.4 g/dL (ref 1.5–4.5)
Glucose: 89 mg/dL (ref 65–99)
Potassium: 4.5 mmol/L (ref 3.5–5.2)
Sodium: 140 mmol/L (ref 134–144)
Total Protein: 6.8 g/dL (ref 6.0–8.5)

## 2020-01-28 LAB — CBC
Hematocrit: 42.9 % (ref 34.0–46.6)
Hemoglobin: 14.7 g/dL (ref 11.1–15.9)
MCH: 31.1 pg (ref 26.6–33.0)
MCHC: 34.3 g/dL (ref 31.5–35.7)
MCV: 91 fL (ref 79–97)
Platelets: 257 10*3/uL (ref 150–450)
RBC: 4.72 x10E6/uL (ref 3.77–5.28)
RDW: 12.1 % (ref 11.7–15.4)
WBC: 5.8 10*3/uL (ref 3.4–10.8)

## 2020-01-28 LAB — LIPID PANEL
Chol/HDL Ratio: 2.7 ratio (ref 0.0–4.4)
Cholesterol, Total: 195 mg/dL (ref 100–199)
HDL: 72 mg/dL (ref >39)
LDL Chol Calc (NIH): 101 mg/dL — ABNORMAL HIGH (ref 0–99)
Triglycerides: 128 mg/dL (ref 0–149)
VLDL Cholesterol Cal: 22 mg/dL (ref 5–40)

## 2020-01-28 LAB — VITAMIN D 25 HYDROXY (VIT D DEFICIENCY, FRACTURES): Vit D, 25-Hydroxy: 44.2 ng/mL (ref 30.0–100.0)

## 2020-01-28 LAB — CYTOLOGY - PAP
Comment: NEGATIVE
Diagnosis: NEGATIVE
High risk HPV: NEGATIVE

## 2020-01-28 LAB — TSH: TSH: 2.2 u[IU]/mL (ref 0.450–4.500)

## 2020-02-10 ENCOUNTER — Telehealth: Payer: Self-pay

## 2020-02-10 NOTE — Telephone Encounter (Signed)
Call to patient, left detailed message, ok per dpr, name identified on voicemail. No alternative number on file.  Advised office phones do go off at 4pm, if experiencing yeast symptoms, can try OTC monistat. Return call to office on 5/28 to further discuss symptoms.

## 2020-02-10 NOTE — Telephone Encounter (Signed)
Patient is calling in regards to having a yeast infection. Patient states she would like to know if the medication can be called into the pharmacy.

## 2020-02-17 NOTE — Telephone Encounter (Signed)
No return call from patient.  ° °Routing to Dr. Miller FYI.  ° °Encounter closed.  °

## 2020-03-03 ENCOUNTER — Telehealth: Payer: Self-pay | Admitting: Obstetrics & Gynecology

## 2020-03-03 DIAGNOSIS — Z803 Family history of malignant neoplasm of breast: Secondary | ICD-10-CM

## 2020-03-03 DIAGNOSIS — R971 Elevated cancer antigen 125 [CA 125]: Secondary | ICD-10-CM

## 2020-03-03 DIAGNOSIS — Z8481 Family history of carrier of genetic disease: Secondary | ICD-10-CM

## 2020-03-03 DIAGNOSIS — Z8041 Family history of malignant neoplasm of ovary: Secondary | ICD-10-CM

## 2020-03-03 NOTE — Telephone Encounter (Signed)
Spoke with pt. Pt states now has maternal 2nd cousin that has same mutation gene BRCA 1 as mother and is more concerned and would like to know recommendations on genetic counseling/testing in the [place] area since lives there now. Pt states would prefer someone at [facility]  if Dr Sabra Heck knows of anyone.  Advised pt will review and get recommendations per Dr Sabra Heck and return call to pt. Pt agreeable and thankful for help.   Routing to Dr Sabra Heck

## 2020-03-03 NOTE — Telephone Encounter (Signed)
Patient asked to talk with Dr.Miller's nurse about "if she should get more genetic testing"? She just learned that a second person in her family tested positive for the BRCA1 mutation.

## 2020-03-03 NOTE — Telephone Encounter (Signed)
Left message for pt to return call to triage RN. 

## 2020-03-03 NOTE — Telephone Encounter (Signed)
I do not know any genetic counselors at [facility] but the number for appts is [phone].  Could you call and see if referral is needed.  If so, ok to place referral for pt due to family hx of BRCA gene.  Thanks.

## 2020-03-06 NOTE — Telephone Encounter (Signed)
Left message with Adrian Prince at [facility] to return call.

## 2020-03-07 NOTE — Telephone Encounter (Signed)
Call received from [facility] coordinator at [facility]. States pt does not need referral, but pt can call for appt or we can set up an appt. Given phone and fax numbers to [facility] to send pt's record.   Referral placed in Epic  Routing to Dr Sabra Heck for review.  Cc: Rosa to set up appt for referral.  Encounter closed.

## 2020-04-28 ENCOUNTER — Encounter: Payer: Self-pay | Admitting: Genetic Counselor

## 2020-06-13 ENCOUNTER — Other Ambulatory Visit: Payer: Self-pay | Admitting: Obstetrics & Gynecology

## 2020-06-13 NOTE — Telephone Encounter (Signed)
Medication refill request: Macrobid Last AEX:  01/27/20 SM Next AEX: none Last MMG (if hormonal medication request): n/a Refill authorized: today, please advise
